# Patient Record
Sex: Female | Born: 2000 | Hispanic: Yes | Marital: Single | Smoking: Never smoker
Health system: Southern US, Community
[De-identification: ages and names within clinical notes are randomized; demographics above are authoritative.]

## PROBLEM LIST (undated history)

## (undated) ENCOUNTER — Inpatient Hospital Stay (HOSPITAL_COMMUNITY): Payer: Self-pay

## (undated) DIAGNOSIS — Z789 Other specified health status: Secondary | ICD-10-CM

## (undated) HISTORY — PX: WISDOM TOOTH EXTRACTION: SHX21

## (undated) HISTORY — PX: TONSILLECTOMY: SUR1361

## (undated) HISTORY — DX: Other specified health status: Z78.9

---

## 2021-09-10 ENCOUNTER — Other Ambulatory Visit: Payer: Self-pay

## 2021-09-10 ENCOUNTER — Emergency Department (HOSPITAL_BASED_OUTPATIENT_CLINIC_OR_DEPARTMENT_OTHER): Payer: Medicaid Other

## 2021-09-10 ENCOUNTER — Encounter (HOSPITAL_BASED_OUTPATIENT_CLINIC_OR_DEPARTMENT_OTHER): Payer: Self-pay | Admitting: *Deleted

## 2021-09-10 ENCOUNTER — Emergency Department (HOSPITAL_BASED_OUTPATIENT_CLINIC_OR_DEPARTMENT_OTHER)
Admission: EM | Admit: 2021-09-10 | Discharge: 2021-09-10 | Disposition: A | Discharge status: Home / Self Care | Payer: Medicaid Other | Attending: Emergency Medicine | Admitting: Emergency Medicine

## 2021-09-10 DIAGNOSIS — S6010XA Contusion of unspecified finger with damage to nail, initial encounter: Secondary | ICD-10-CM

## 2021-09-10 DIAGNOSIS — S60111A Contusion of right thumb with damage to nail, initial encounter: Secondary | ICD-10-CM | POA: Insufficient documentation

## 2021-09-10 DIAGNOSIS — S60931A Unspecified superficial injury of right thumb, initial encounter: Secondary | ICD-10-CM | POA: Diagnosis present

## 2021-09-10 DIAGNOSIS — W230XXA Caught, crushed, jammed, or pinched between moving objects, initial encounter: Secondary | ICD-10-CM | POA: Insufficient documentation

## 2021-09-10 NOTE — ED Notes (Signed)
Discharge instructions discussed with pt. Pt verbalized understanding with no questions at this time.  

## 2021-09-10 NOTE — ED Provider Notes (Signed)
[facility] EMERGENCY DEPARTMENT Provider Note   CSN: [id number] Arrival date & time: 09/10/21  1824     History Chief Complaint  Patient presents with   Finger Injury    Abigail Patterson is a 20 y.o. female.  Patient is a 20 year old female who presents with an injury to her right thumb.  She says she crushed in a car door this morning.  She has had some increasing subungual hematoma that started this morning and got bigger throughout the day.  She says she feels a lot of pain and pressure under her nail.  She denies any other injuries.      History reviewed. No pertinent past medical history.  There are no problems to display for this patient.   History reviewed. No pertinent surgical history.   OB History   No obstetric history on file.     No family history on file.  Social History   Tobacco Use   Smoking status: Never   Smokeless tobacco: Never  Substance Use Topics   Alcohol use: Never   Drug use: Never    Home Medications Prior to Admission medications   Not on File    Allergies    Patient has no known allergies.  Review of Systems   Review of Systems  Constitutional:  Negative for fever.  Gastrointestinal:  Negative for nausea and vomiting.  Musculoskeletal:  Positive for arthralgias and joint swelling. Negative for back pain and neck pain.  Skin:  Negative for wound.  Neurological:  Negative for weakness, numbness and headaches.   Physical Exam Updated Vital Signs BP 132/86   Pulse 91   Temp 98.7 F (37.1 C) (Oral)   Resp 20   Ht 5\' 2"  (1.575 m)   Wt 59 kg   LMP 07/20/2021   SpO2 100%   BMI 23.78 kg/m   Physical Exam Constitutional:      Appearance: She is well-developed.  HENT:     Head: Normocephalic and atraumatic.  Cardiovascular:     Rate and Rhythm: Normal rate.  Pulmonary:     Effort: Pulmonary effort is normal.  Musculoskeletal:        General: Tenderness present.     Cervical back: Normal range of motion and  neck supple.     Comments: Tenderness over right IP joint of thumb.  Moderate subungual hematoma present.  No open wounds  Skin:    General: Skin is warm and dry.  Neurological:     Mental Status: She is alert and oriented to person, place, and time.    ED Results / Procedures / Treatments   Labs (all labs ordered are listed, but only abnormal results are displayed) Labs Reviewed - No data to display  EKG None  Radiology DG Finger Thumb Right  Result Date: 09/10/2021 CLINICAL DATA:  Status post trauma. EXAM: RIGHT THUMB 2+V COMPARISON:  None. FINDINGS: There is no evidence of fracture or dislocation. There is no evidence of arthropathy or other focal bone abnormality. Soft tissues are unremarkable. IMPRESSION: Negative. Electronically Signed   By: 09/12/2021 M.D.   On: 09/10/2021 19:17    Procedures .Nail Removal  Date/Time: 09/10/2021 8:29 PM Performed by: 09/12/2021, MD Authorized by: Rolan Bucco, MD   Consent:    Consent obtained:  Verbal   Consent given by:  Patient   Risks, benefits, and alternatives were discussed: yes     Risks discussed:  Bleeding and infection   Alternatives discussed:  No treatment  Universal protocol:    Procedure explained and questions answered to patient or proxy's satisfaction: yes     Patient identity confirmed:  Verbally with patient Location:    Hand:  R thumb Pre-procedure details:    Skin preparation:  Alcohol Anesthesia:    Anesthesia method:  None Trephination:    Subungual hematoma drained: yes     Trephination instrument:  Cautery    Medications Ordered in ED Medications - No data to display  ED Course  I have reviewed the triage vital signs and the nursing notes.  Pertinent labs & imaging results that were available during my care of the patient were reviewed by me and considered in my medical decision making (see chart for details).    MDM Rules/Calculators/A&P                           Patient  presents with crush injury to the finger.  X-rays reveal no evidence of fracture.  There is a moderate subungual hematoma.  This was trephinated using cautery.  There was immediate release of the blood and she feels much better.  Dressing was applied.  She was discharged home in good condition.  Wound care instructions were given.  Return precautions were given. Final Clinical Impression(s) / ED Diagnoses Final diagnoses:  Subungual hematoma of digit of hand, initial encounter    Rx / DC Orders ED Discharge Orders     None        Rolan Bucco, MD 09/10/21 2032

## 2021-09-10 NOTE — ED Triage Notes (Signed)
She crushed her right thumb in a car door earlier today. Pressure under her nail with dark coloration noted.

## 2021-09-10 NOTE — ED Notes (Signed)
Provided cautery to Dr Fredderick Phenix for pt.

## 2022-10-22 IMAGING — DX DG FINGER THUMB 2+V*R*
3 series · 3 of 3 positions shown · non-contrast
Comparison: None.

CLINICAL DATA: Status post trauma.

EXAM:
RIGHT THUMB 2+V

[finger ap]
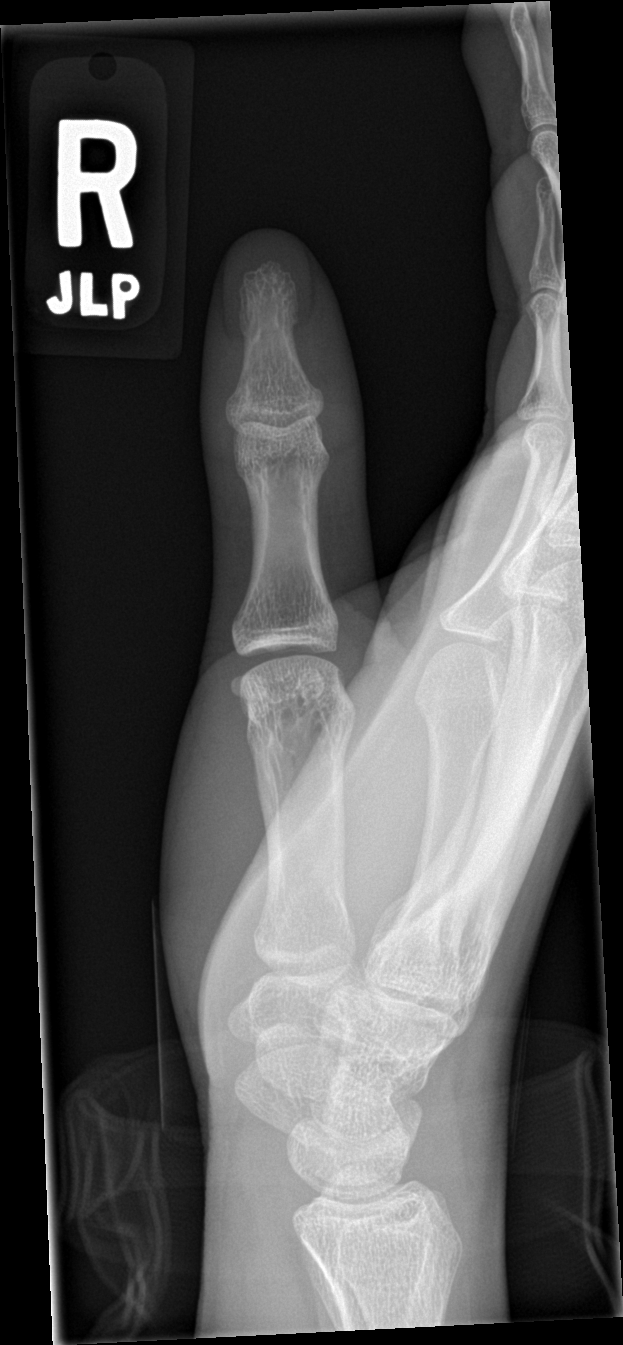

[finger obl]
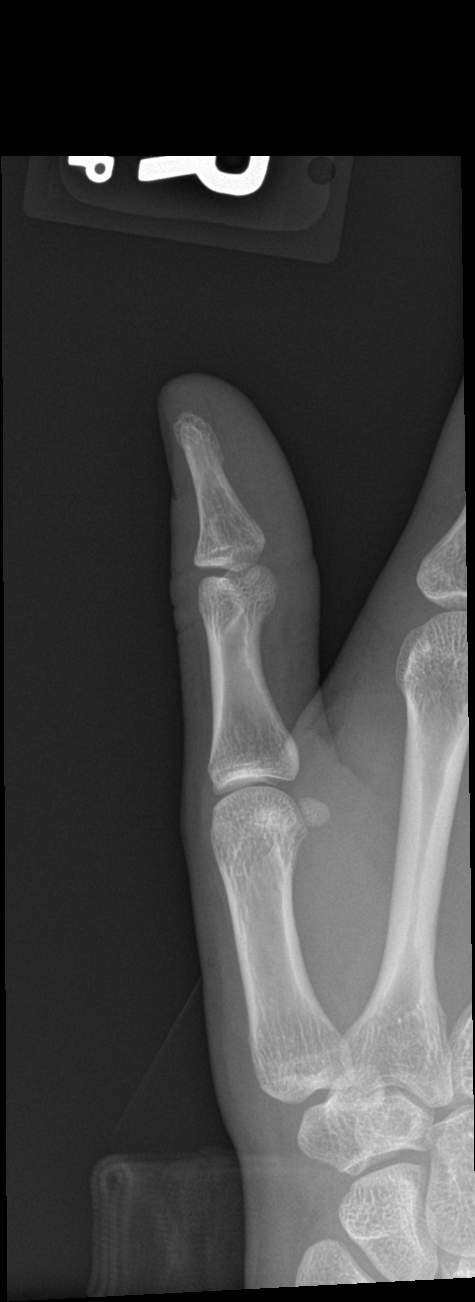

[finger lat]
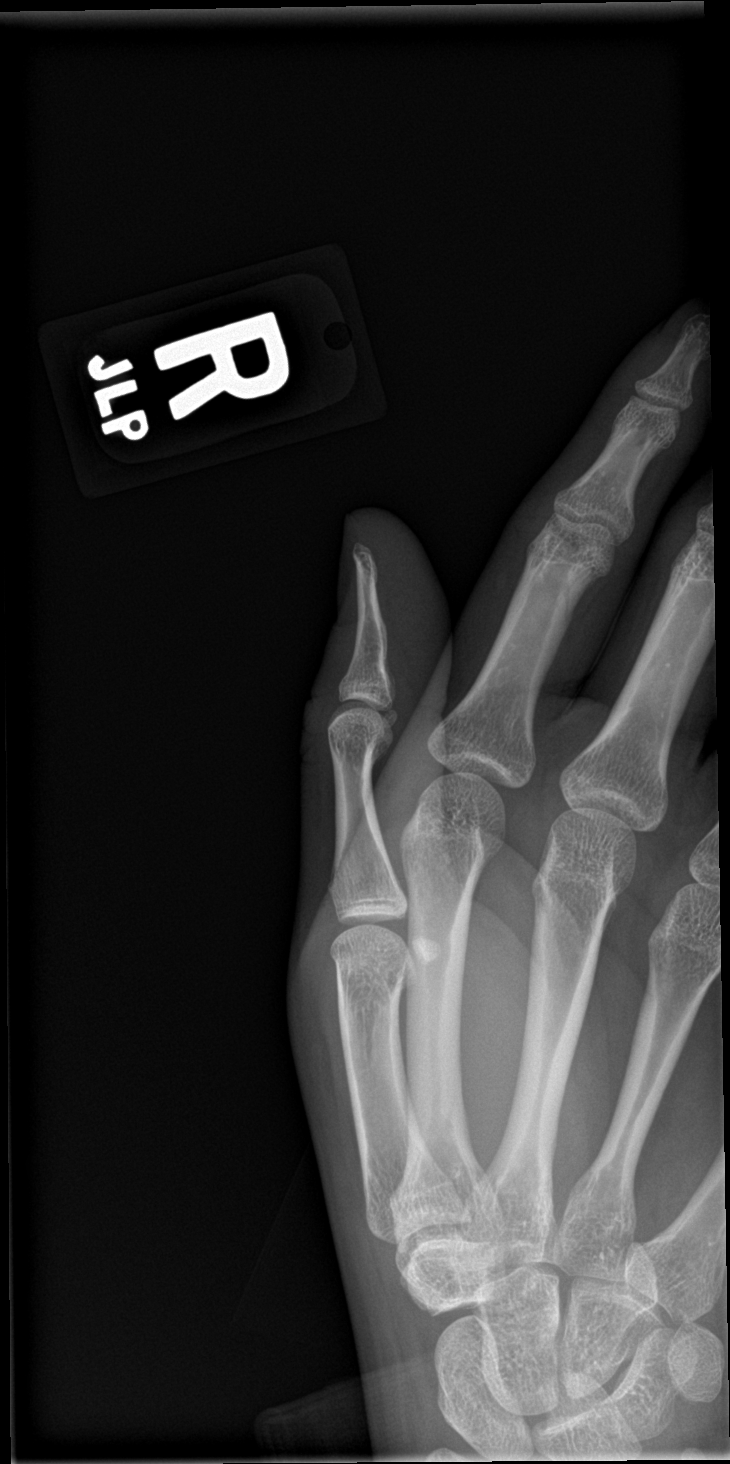

[3 of 3 positions shown; findings below may reference images not displayed]

FINDINGS: There is no evidence of fracture or dislocation. There is no
evidence of arthropathy or other focal bone abnormality. Soft
tissues are unremarkable.
IMPRESSION: Negative.

## 2023-09-01 ENCOUNTER — Ambulatory Visit (INDEPENDENT_AMBULATORY_CARE_PROVIDER_SITE_OTHER): Payer: Medicaid Other | Admitting: Family Medicine

## 2023-09-01 VITALS — BP 110/80 | HR 75 | Ht 62.0 in | Wt 131.7 lb

## 2023-09-01 DIAGNOSIS — Z3481 Encounter for supervision of other normal pregnancy, first trimester: Secondary | ICD-10-CM | POA: Diagnosis not present

## 2023-09-01 DIAGNOSIS — Z3201 Encounter for pregnancy test, result positive: Secondary | ICD-10-CM

## 2023-09-01 DIAGNOSIS — Z3200 Encounter for pregnancy test, result unknown: Secondary | ICD-10-CM

## 2023-09-01 LAB — POCT PREGNANCY, URINE: Preg Test, Ur: POSITIVE — AB

## 2023-09-01 MED ORDER — PROMETHAZINE HCL 12.5 MG PO TABS: 12.5000 mg | ORAL_TABLET | Freq: Four times a day (QID) | ORAL | 0 refills | Status: DC | PRN

## 2023-09-01 MED ORDER — PRENATAL PLUS VITAMIN/MINERAL 27-1 MG PO TABS: 1.0000 | ORAL_TABLET | Freq: Every day | ORAL | 11 refills | Status: AC

## 2023-09-01 NOTE — Progress Notes (Signed)
  History:  [name] is a 22 y.o. G1P0 who presents to clinic today with complaint of possible pregnancy.   Positive pregnancy Unplanned but welcome Regular period prior to conception  History reviewed. No pertinent past medical history.  Past Surgical History:  Procedure Laterality Date   WISDOM TOOTH EXTRACTION      The following portions of the patient's history were reviewed and updated as appropriate: allergies, current medications, past family history, past medical history, past social history, past surgical history and problem list.   Review of Systems:  Pertinent items noted in HPI and remainder of comprehensive ROS otherwise negative.  Objective:  Physical Exam BP 110/80   Pulse 75   Ht 5\' 2"  (1.575 m)   Wt 131 lb 11.2 oz (59.7 kg)   BMI 24.09 kg/m  Physical Exam Vitals reviewed.  Constitutional:      General: She is not in acute distress.    Appearance: She is well-developed. She is not diaphoretic.  Eyes:     General: No scleral icterus. Pulmonary:     Effort: Pulmonary effort is normal. No respiratory distress.  Skin:    General: Skin is warm and dry.  Neurological:     Mental Status: She is alert.     Coordination: Coordination normal.      Labs and Imaging Results for orders placed or performed in visit on 09/01/23 (from the past 24 hour(s))  Pregnancy, urine POC     Status: Abnormal   Collection Time: 09/01/23  9:00 AM  Result Value Ref Range   Preg Test, Ur POSITIVE (A) NEGATIVE    No results found.   Assessment & Plan:  1. Possible pregnancy +UPT, congratulated No medical issues First pregnancy Dating [place]   Approximately 15 minutes of total time was spent with this patient on history taking, coordination of care, education and documentation.   Abigail Maples, MD 09/01/2023 9:35 AM

## 2023-09-01 NOTE — Progress Notes (Signed)
Possible Pregnancy  Here today for pregnancy confirmation. UPT in office today is positive. Pt reports first positive home UPT on 08/08/2023. Reviewed dating with patient:   LMP: Exact LMP on 06/29/2023 EDD: 04/04/2024 9w 1d today  OB history reviewed. Reviewed medications and allergies with patient; list of medications safe to take during pregnancy given.  Recommended pt begin prenatal vitamin and schedule prenatal care.   Quintella Reichert, RN 09/01/2023  9:04 AM

## 2023-09-06 ENCOUNTER — Ambulatory Visit: Payer: Medicaid Other

## 2023-09-06 ENCOUNTER — Other Ambulatory Visit: Payer: Self-pay

## 2023-09-06 ENCOUNTER — Encounter: Payer: Self-pay | Admitting: Family Medicine

## 2023-09-06 DIAGNOSIS — Z3A09 9 weeks gestation of pregnancy: Secondary | ICD-10-CM

## 2023-09-06 DIAGNOSIS — Z3201 Encounter for pregnancy test, result positive: Secondary | ICD-10-CM

## 2023-09-06 DIAGNOSIS — Z3491 Encounter for supervision of normal pregnancy, unspecified, first trimester: Secondary | ICD-10-CM | POA: Diagnosis not present

## 2023-09-06 DIAGNOSIS — Z3200 Encounter for pregnancy test, result unknown: Secondary | ICD-10-CM

## 2023-09-11 ENCOUNTER — Telehealth: Payer: Medicaid Other

## 2023-09-11 DIAGNOSIS — Z3490 Encounter for supervision of normal pregnancy, unspecified, unspecified trimester: Secondary | ICD-10-CM

## 2023-09-11 NOTE — Progress Notes (Signed)
Called pt to complete intake appt.  Pt reports that she wanted to cancel her appt as she was told that our office does not take her [facility] insurance.  I informed pt that we do take her insurance and would love to take care of her and her baby.  Pt agrees to being rescheduled for intake appt (09/18/23) and call transferred to the front office so that pt's insurance can be verified.    Gaetano Romberger,RN  09/11/23

## 2023-09-18 ENCOUNTER — Telehealth: Payer: Medicaid Other

## 2023-09-18 ENCOUNTER — Encounter: Payer: Self-pay | Admitting: Family Medicine

## 2023-09-18 DIAGNOSIS — Z3A11 11 weeks gestation of pregnancy: Secondary | ICD-10-CM

## 2023-09-18 DIAGNOSIS — Z3401 Encounter for supervision of normal first pregnancy, first trimester: Secondary | ICD-10-CM | POA: Diagnosis not present

## 2023-09-18 DIAGNOSIS — Z3480 Encounter for supervision of other normal pregnancy, unspecified trimester: Secondary | ICD-10-CM

## 2023-09-18 MED ORDER — BLOOD PRESSURE KIT DEVI: 1.0000 | 0 refills | Status: DC | PRN

## 2023-09-18 MED ORDER — GOJJI WEIGHT SCALE MISC: 1.0000 | 0 refills | Status: DC | PRN

## 2023-09-18 NOTE — Progress Notes (Signed)
New OB Intake  I connected with Smitty Knudsen  on 09/18/23 at  3:15 PM EST by MyChart Video Visit and verified that I am speaking with the correct person using two identifiers. Nurse is located at [facility] and pt is located at home.  I discussed the limitations, risks, security and privacy concerns of performing an evaluation and management service by telephone and the availability of in person appointments. I also discussed with the patient that there may be a patient responsible charge related to this service. The patient expressed understanding and agreed to proceed.  I explained I am completing New OB Intake today. We discussed EDD of 04/04/2024, by Last Menstrual Period. Pt is G1P0. I reviewed her allergies, medications and Medical/Surgical/OB history.   Patient Active Problem List   Diagnosis Date Noted   Supervision of other normal pregnancy, antepartum 09/18/2023    Concerns addressed today: -Unsure if deliver at [facility], patient is located in [place].   Delivery Plans Plans to deliver at [facility] Discussed the nature of our practice with multiple providers including residents and students. Due to the size of the practice, the delivering provider may not be the same as those providing prenatal care.   Patient is not interested in water birth. Offered upcoming OB visit with CNM to discuss further.  MyChart/Babyscripts MyChart access verified. I explained pt will have some visits in office and some virtually. Babyscripts instructions given and order placed. Patient verifies receipt of registration text/e-mail. Account successfully created and app downloaded.  Blood Pressure Cuff/Weight Scale Blood pressure cuff ordered for patient to pick-up from [employer]. Explained after first prenatal appt pt will check weekly and document in Babyscripts. Patient does not have weight scale; order sent to [facility], patient may track weight weekly in Babyscripts.  Anatomy  US Explained first scheduled [place] will be around 19 weeks. Anatomy US scheduled for 11/09/2023 at 2:30 PM.  Is patient a CenteringPregnancy candidate?  Patient will think about it.   Is patient a Mom+Baby Combined Care candidate?  Patient will think about it.     If accepted, confirm patient does not intend to move from the area for at least 12 months, then notify Mom+Baby staff  Interested in [place]? If yes, send referral and doula dot phrase.   Is patient a candidate for Babyscripts Optimization? Yes, patient declined   First visit review I reviewed new OB appt with patient. Explained pt will be seen by Celedonio Savage, MD  at first visit. Discussed Avelina Laine genetic screening with patient. Yes [facility]or[facility] and [facility].. Routine prenatal labs is not   Last Pap 09/06/2022; Negative @ [facility] in care everywhere.   Vidal Schwalbe, [place] 09/18/2023  3:19 PM

## 2023-09-19 ENCOUNTER — Ambulatory Visit (INDEPENDENT_AMBULATORY_CARE_PROVIDER_SITE_OTHER): Payer: Self-pay | Admitting: Family Medicine

## 2023-09-19 ENCOUNTER — Other Ambulatory Visit (HOSPITAL_COMMUNITY)
Admission: RE | Admit: 2023-09-19 | Discharge: 2023-09-19 | Disposition: A | Discharge status: Home / Self Care | Payer: Medicaid Other | Source: Ambulatory Visit | Attending: Family Medicine | Admitting: Family Medicine

## 2023-09-19 ENCOUNTER — Other Ambulatory Visit: Payer: Self-pay

## 2023-09-19 VITALS — BP 117/71 | HR 89 | Wt 133.0 lb

## 2023-09-19 DIAGNOSIS — Z3481 Encounter for supervision of other normal pregnancy, first trimester: Secondary | ICD-10-CM

## 2023-09-19 DIAGNOSIS — Z3A11 11 weeks gestation of pregnancy: Secondary | ICD-10-CM

## 2023-09-19 DIAGNOSIS — Z3401 Encounter for supervision of normal first pregnancy, first trimester: Secondary | ICD-10-CM

## 2023-09-19 DIAGNOSIS — N898 Other specified noninflammatory disorders of vagina: Secondary | ICD-10-CM

## 2023-09-19 DIAGNOSIS — Z3480 Encounter for supervision of other normal pregnancy, unspecified trimester: Secondary | ICD-10-CM

## 2023-09-19 MED ORDER — ASPIRIN 81 MG PO TBEC: 81.0000 mg | DELAYED_RELEASE_TABLET | Freq: Every day | ORAL | 12 refills | Status: DC

## 2023-09-19 NOTE — Patient Instructions (Signed)

## 2023-09-19 NOTE — Progress Notes (Signed)
Subjective:   Abigail Patterson is a 22 y.o. G1P0 at [redacted]w[redacted]d by LMP being seen today for her first obstetrical visit.  Her obstetrical history is significant for  none . Patient does intend to breast feed. Pregnancy history fully reviewed.  Patient reports no bleeding, no contractions, no cramping, and no leaking.  HISTORY: OB History  Gravida Para Term Preterm AB Living  1 0 0 0 0 0  SAB IAB Ectopic Multiple Live Births  0 0 0 0 0    # Outcome Date GA Lbr Len/2nd Weight Sex Type Anes PTL Lv  1 Current            Last pap smear was  09/06/2022 and was normal Past Medical History:  Diagnosis Date   Medical history non-contributory    Past Surgical History:  Procedure Laterality Date   TONSILLECTOMY     WISDOM TOOTH EXTRACTION     Family History  Problem Relation Age of Onset   Hypertension Maternal Grandmother    Diabetes Maternal Grandmother    Diabetes Maternal Grandfather    Hypertension Maternal Grandfather    Hypertension Paternal Grandmother    Diabetes Paternal Grandmother    Diabetes Paternal Grandfather    Hypertension Paternal Grandfather    Social History   Tobacco Use   Smoking status: Never   Smokeless tobacco: Never  Vaping Use   Vaping status: Never Used  Substance Use Topics   Alcohol use: Never   Drug use: Never   No Known Allergies Current Outpatient Medications on File Prior to Visit  Medication Sig Dispense Refill   Prenatal Vit-Fe Fumarate-FA (PRENATAL PLUS VITAMIN/MINERAL) 27-1 MG TABS Take 1 tablet by mouth daily. 30 tablet 11   promethazine (PHENERGAN) 12.5 MG tablet Take 1 tablet (12.5 mg total) by mouth every 6 (six) hours as needed for nausea or vomiting. 30 tablet 0   Blood Pressure Monitoring (BLOOD PRESSURE KIT) DEVI 1 Device by Does not apply route as needed. (Patient not taking: Reported on 09/19/2023) 1 each 0   Misc. Devices (GOJJI WEIGHT SCALE) MISC 1 Device by Does not apply route as needed. (Patient not taking: Reported on  09/19/2023) 1 each 0   No current facility-administered medications on file prior to visit.     Exam   Vitals:   09/19/23 1447  BP: 117/71  Pulse: 89  Weight: 133 lb (60.3 kg)   Fetal Heart Rate (bpm): 164  System: General: well-developed, well-nourished female in no acute distress   Skin: normal coloration and turgor, no rashes   Neurologic: oriented, normal, negative, normal mood   Extremities: normal strength, tone, and muscle mass, ROM of all joints is normal   HEENT PERRLA, extraocular movement intact and sclera clear, anicteric   Mouth/Teeth mucous membranes moist, pharynx normal without lesions and dental hygiene good   Neck supple and no masses   Cardiovascular: regular rate and rhythm   Respiratory:  no respiratory distress, normal breath sounds   Abdomen: soft, non-tender; bowel sounds normal; no masses,  no organomegaly     Assessment:   Pregnancy: G1P0 Patient Active Problem List   Diagnosis Date Noted   Supervision of other normal pregnancy, antepartum 09/18/2023     Plan:  1. Supervision of other normal pregnancy, antepartum FHR and BP appropriate today Patient meets criteria for aspirin because of multiparity and maternal history of gestational hypertension - CBC/D/Plt+RPR+Rh+ABO+RubIgG... - Culture, OB Urine - Hemoglobin A1c - Cervicovaginal ancillary only  2. Vaginal discharge  Wet prep collected today  3. Encounter for supervision of other normal pregnancy, first trimester - HORIZON Basic Panel  4. Encounter for supervision of other normal pregnancy in first trimester - PANORAMA PRENATAL TEST   Initial labs drawn. Continue prenatal vitamins. Genetic Screening discussed, NIPS: ordered. Ultrasound discussed; fetal anatomic survey: ordered.  Scheduled for 11/09/2023 Problem list reviewed and updated. The nature of [facility] Faculty Practice with multiple MDs and other Advanced Practice Providers was explained to patient;  also emphasized that residents, students are part of our team. Routine obstetric precautions reviewed. No follow-ups on file.

## 2023-09-20 ENCOUNTER — Encounter: Payer: Self-pay | Admitting: *Deleted

## 2023-09-20 LAB — CBC/D/PLT+RPR+RH+ABO+RUBIGG...
Antibody Screen: NEGATIVE
Basophils Absolute: 0.1 10*3/uL (ref 0.0–0.2)
Basos: 1 %
EOS (ABSOLUTE): 0.3 10*3/uL (ref 0.0–0.4)
Eos: 3 %
HCV Ab: NONREACTIVE
HIV Screen 4th Generation wRfx: NONREACTIVE
Hematocrit: 40 % (ref 34.0–46.6)
Hemoglobin: 13.6 g/dL (ref 11.1–15.9)
Hepatitis B Surface Ag: NEGATIVE
Immature Grans (Abs): 0 10*3/uL (ref 0.0–0.1)
Immature Granulocytes: 0 %
Lymphocytes Absolute: 2.2 10*3/uL (ref 0.7–3.1)
Lymphs: 22 %
MCH: 33 pg (ref 26.6–33.0)
MCHC: 34 g/dL (ref 31.5–35.7)
MCV: 97 fL (ref 79–97)
Monocytes Absolute: 0.7 10*3/uL (ref 0.1–0.9)
Monocytes: 7 %
Neutrophils Absolute: 6.5 10*3/uL (ref 1.4–7.0)
Neutrophils: 67 %
Platelets: 290 10*3/uL (ref 150–450)
RBC: 4.12 x10E6/uL (ref 3.77–5.28)
RDW: 11.9 % (ref 11.7–15.4)
RPR Ser Ql: NONREACTIVE
Rh Factor: POSITIVE
Rubella Antibodies, IGG: 11.2 {index} (ref >0.99)
WBC: 9.6 10*3/uL (ref 3.4–10.8)

## 2023-09-20 LAB — HEMOGLOBIN A1C
Est. average glucose Bld gHb Est-mCnc: 97 mg/dL
Hgb A1c MFr Bld: 5 % (ref 4.8–5.6)

## 2023-09-20 LAB — HCV INTERPRETATION

## 2023-09-21 LAB — URINE CULTURE, OB REFLEX

## 2023-09-21 LAB — CULTURE, OB URINE

## 2023-09-22 LAB — CERVICOVAGINAL ANCILLARY ONLY
Bacterial Vaginitis (gardnerella): NEGATIVE
Candida Glabrata: NEGATIVE
Candida Vaginitis: NEGATIVE
Chlamydia: NEGATIVE
Comment: NEGATIVE
Comment: NEGATIVE
Comment: NEGATIVE
Comment: NEGATIVE
Comment: NEGATIVE
Comment: NORMAL
Neisseria Gonorrhea: NEGATIVE
Trichomonas: NEGATIVE

## 2023-09-26 ENCOUNTER — Encounter: Payer: Self-pay | Admitting: Obstetrics and Gynecology

## 2023-09-26 LAB — PANORAMA PRENATAL TEST FULL PANEL:PANORAMA TEST PLUS 5 ADDITIONAL MICRODELETIONS: FETAL FRACTION: 8.6

## 2023-09-26 LAB — HORIZON CUSTOM: REPORT SUMMARY: NEGATIVE

## 2023-10-17 ENCOUNTER — Other Ambulatory Visit: Payer: Self-pay

## 2023-10-17 ENCOUNTER — Ambulatory Visit (INDEPENDENT_AMBULATORY_CARE_PROVIDER_SITE_OTHER): Payer: Medicaid Other | Admitting: Obstetrics and Gynecology

## 2023-10-17 VITALS — BP 119/81 | HR 86 | Wt 134.7 lb

## 2023-10-17 DIAGNOSIS — Z3480 Encounter for supervision of other normal pregnancy, unspecified trimester: Secondary | ICD-10-CM

## 2023-10-17 DIAGNOSIS — Z3A15 15 weeks gestation of pregnancy: Secondary | ICD-10-CM

## 2023-10-17 DIAGNOSIS — Z3482 Encounter for supervision of other normal pregnancy, second trimester: Secondary | ICD-10-CM

## 2023-10-17 NOTE — Patient Instructions (Signed)

## 2023-10-17 NOTE — Progress Notes (Signed)
   PRENATAL VISIT NOTE  Subjective:  Abigail Patterson is a 22 y.o. G1P0 at [redacted]w[redacted]d being seen today for ongoing prenatal care.  She is currently monitored for the following issues for this low-risk pregnancy and has Supervision of other normal pregnancy, antepartum on their problem list.  Patient reports no bleeding and stuffy nose and mild congestion  .  Contractions: Not present. Vag. Bleeding: None.  Movement: Absent. Denies leaking of fluid.   The following portions of the patient's history were reviewed and updated as appropriate: allergies, current medications, past family history, past medical history, past social history, past surgical history and problem list.   Objective:   Vitals:   10/17/23 1343  BP: 119/81  Pulse: 86  Weight: 134 lb 11.2 oz (61.1 kg)    Fetal Status: Fetal Heart Rate (bpm): 145   Movement: Absent     General:  Alert, oriented and cooperative. Patient is in no acute distress.  Skin: Skin is warm and dry. No rash noted.   Cardiovascular: Normal heart rate noted  Respiratory: Normal respiratory effort, no problems with respiration noted  Abdomen: Soft, gravid, appropriate for gestational age.  Pain/Pressure: Present     Pelvic: Cervical exam deferred        Extremities: Normal range of motion.  Edema: None  Mental Status: Normal mood and affect. Normal behavior. Normal judgment and thought content.   Assessment and Plan:  Pregnancy: G1P0 at [redacted]w[redacted]d 1. Supervision of other normal pregnancy, antepartum (Primary) FHR wnl  Discussed AFP testing - AFP, Serum, Open Spina Bifida  2. [redacted] weeks gestation of pregnancy AFP  Preterm labor symptoms and general obstetric precautions including but not limited to vaginal bleeding, contractions, leaking of fluid and fetal movement were reviewed in detail with the patient. Please refer to After Visit Summary for other counseling recommendations.   No follow-ups on file.  Future Appointments  Date Time Provider Department  Center  11/09/2023  2:15 PM [facility] NURSE [facility]  11/09/2023  2:30 PM [facility] US2 [facility]    Carter Quarry, MD

## 2023-10-18 NOTE — L&D Delivery Note (Signed)
 OB/GYN Faculty Practice Delivery Note  [name] is a 23 y.o. G1P0 s/p SVD at [redacted]w[redacted]d. She was admitted for SOL.   ROM: 3h 80m with clear fluid GBS Status: Positive - received adequate PCN prior to delivery  Maximum Maternal Temperature: 99.43F  Labor Progress: Initial SVE 5/100/0. Received AROM and pitocin. Progressed to complete.   Delivery Date/Time: 04/04/24 @ 1253 Delivery: Called to room and patient was complete and pushing. Head delivered LOA. No nuchal cord present. Shoulder and body delivered in usual fashion. Infant with spontaneous cry, placed on mother's abdomen, dried and stimulated. Cord clamped x 2 after 1-minute delay, and cut by FOB under direct supervision. Cord blood drawn. I/O bladder catheterization performed with 50cc clear urine drained. Placenta delivered spontaneously with gentle cord traction. Fundus firm with massage and Pitocin. Labia, perineum, vagina, and cervix were inspected. A left vaginal sulcus laceration and left periurethral tears noted, which were repaired with in the usual fashion.  Lower uterine manually swept with removal of several small to moderate sized clots.  Hemostasis achieved.   Placenta: complete, three vessel cord appreciated Complications: N/a Lacerations: 1st degree vaginal tear  EBL: 203 mL Analgesia: Epidural  Infant:  APGAR (1 MIN):  8 APGAR (5 MINS):  9   Carey Chapman, MD   Attestation of Supervision of Resident:  I confirm that I have verified the information documented in the resident's note and that I have also was gloved and available for the entirety of the delivery and personally performed the repair.  I have verified that all services and findings are accurately documented in this resident's note; and I agree with management and plan as outlined in the documentation. I have also made any necessary editorial changes.   Melanie Spires, MD OB Fellow, Faculty Practice [facility], Center for [facility]

## 2023-10-20 LAB — AFP, SERUM, OPEN SPINA BIFIDA
AFP MoM: 0.81
AFP Value: 27.8 ng/mL
Gest. Age on Collection Date: 15.7 wk
Maternal Age At EDD: 23.1 a
OSBR Risk 1 IN: 10000
Test Results:: NEGATIVE
Weight: 134 [lb_av]

## 2023-11-09 ENCOUNTER — Ambulatory Visit: Payer: Medicaid Other | Attending: Family Medicine

## 2023-11-09 ENCOUNTER — Ambulatory Visit: Payer: Medicaid Other

## 2023-11-09 ENCOUNTER — Other Ambulatory Visit: Payer: Self-pay

## 2023-11-09 DIAGNOSIS — Z363 Encounter for antenatal screening for malformations: Secondary | ICD-10-CM | POA: Diagnosis not present

## 2023-11-09 DIAGNOSIS — Z3480 Encounter for supervision of other normal pregnancy, unspecified trimester: Secondary | ICD-10-CM | POA: Diagnosis present

## 2023-11-09 DIAGNOSIS — Z3A19 19 weeks gestation of pregnancy: Secondary | ICD-10-CM | POA: Insufficient documentation

## 2023-11-14 ENCOUNTER — Other Ambulatory Visit: Payer: Self-pay

## 2023-11-14 ENCOUNTER — Ambulatory Visit (INDEPENDENT_AMBULATORY_CARE_PROVIDER_SITE_OTHER): Payer: Medicaid Other | Admitting: Certified Nurse Midwife

## 2023-11-14 VITALS — BP 109/71 | HR 88 | Wt 144.4 lb

## 2023-11-14 DIAGNOSIS — Z3A19 19 weeks gestation of pregnancy: Secondary | ICD-10-CM | POA: Insufficient documentation

## 2023-11-14 DIAGNOSIS — O99012 Anemia complicating pregnancy, second trimester: Secondary | ICD-10-CM

## 2023-11-14 DIAGNOSIS — O99019 Anemia complicating pregnancy, unspecified trimester: Secondary | ICD-10-CM | POA: Insufficient documentation

## 2023-11-14 DIAGNOSIS — Z3402 Encounter for supervision of normal first pregnancy, second trimester: Secondary | ICD-10-CM | POA: Insufficient documentation

## 2023-11-14 MED ORDER — FERROUS GLUCONATE 324 (38 FE) MG PO TABS: 324.0000 mg | ORAL_TABLET | ORAL | 1 refills | Status: DC

## 2023-11-14 MED ORDER — DOCUSATE SODIUM 100 MG PO CAPS: 100.0000 mg | ORAL_CAPSULE | Freq: Two times a day (BID) | ORAL | 0 refills | Status: DC

## 2023-11-14 NOTE — Progress Notes (Signed)
   PRENATAL VISIT NOTE  Subjective:  Abigail Patterson is a 23 y.o. G1P0 at [redacted]w[redacted]d being seen today for ongoing prenatal care.  She is currently monitored for the following issues for this low-risk pregnancy and has Supervision of other normal pregnancy, antepartum; Anemia affecting pregnancy; [redacted] weeks gestation of pregnancy; and Encounter for supervision of low-risk first pregnancy in second trimester on their problem list.  Patient reports fatigue and excessive sleep, SOB on exertion only .   . Vag. Bleeding: None.  Movement: Present. Denies leaking of fluid.   The following portions of the patient's history were reviewed and updated as appropriate: allergies, current medications, past family history, past medical history, past social history, past surgical history and problem list.   Objective:   Vitals:   11/14/23 1438  BP: 109/71  Pulse: 88  Weight: 144 lb 6.4 oz (65.5 kg)    Fetal Status: Fetal Heart Rate (bpm): 141   Movement: Present     General:  Alert, oriented and cooperative. Appears pale, NAD  Skin: Skin is warm and dry. Visually pale    Cardiovascular: Normal heart rate noted  Respiratory: Normal respiratory effort, no problems with respiration noted  Abdomen: Soft, gravid, appropriate for gestational age.    Pelvic: Cervical exam deferred        Extremities: Normal range of motion.     Mental Status: Normal mood and affect. Normal behavior. Normal judgment and thought content.   Assessment and Plan:  Pregnancy: G1P0 at [redacted]w[redacted]d   1. Encounter for supervision of low-risk first pregnancy in second trimester (Primary) - Anatomy US reviewed  2. [redacted] weeks gestation of pregnancy  3. Anemia affecting pregnancy in second trimester - c/o fatigue, SOB with exertion and c/o excessive sleep , patient not currentlly taking iron supplements and after reviewing her diet unable to confirm dietary iron consumption is sufficient in pregnancy  - CBC  - RX sent for Ferrous every other day  and Colace prn  Preterm labor symptoms and general obstetric precautions including but not limited to vaginal bleeding, contractions, leaking of fluid and fetal movement were reviewed in detail with the patient. Please refer to After Visit Summary for other counseling recommendations.   Return in about 4 weeks (around 12/12/2023) for LOB.  Orders Placed This Encounter  Procedures   CBC     Marcell Barlow, MSN, [facility], Center for [employer]

## 2023-11-15 ENCOUNTER — Encounter: Payer: Self-pay | Admitting: Obstetrics and Gynecology

## 2023-11-15 LAB — CBC
Hematocrit: 35.2 % (ref 34.0–46.6)
Hemoglobin: 11.7 g/dL (ref 11.1–15.9)
MCH: 33.6 pg — ABNORMAL HIGH (ref 26.6–33.0)
MCHC: 33.2 g/dL (ref 31.5–35.7)
MCV: 101 fL — ABNORMAL HIGH (ref 79–97)
Platelets: 254 10*3/uL (ref 150–450)
RBC: 3.48 x10E6/uL — ABNORMAL LOW (ref 3.77–5.28)
RDW: 13.1 % (ref 11.7–15.4)
WBC: 11.6 10*3/uL — ABNORMAL HIGH (ref 3.4–10.8)

## 2023-12-12 ENCOUNTER — Other Ambulatory Visit: Payer: Self-pay

## 2023-12-12 ENCOUNTER — Ambulatory Visit: Payer: Medicaid Other | Admitting: Certified Nurse Midwife

## 2023-12-12 VITALS — BP 118/74 | HR 80 | Wt 152.3 lb

## 2023-12-12 DIAGNOSIS — Z3A23 23 weeks gestation of pregnancy: Secondary | ICD-10-CM

## 2023-12-12 DIAGNOSIS — Z3402 Encounter for supervision of normal first pregnancy, second trimester: Secondary | ICD-10-CM

## 2023-12-12 DIAGNOSIS — O99012 Anemia complicating pregnancy, second trimester: Secondary | ICD-10-CM | POA: Diagnosis not present

## 2023-12-12 NOTE — Patient Instructions (Addendum)
 Safe Medications in Pregnancy   Acne:  Benzoyl Peroxide  Salicylic Acid   Backache/Headache:  Tylenol: 2 regular strength every 4 hours OR               2 Extra strength every 6 hours   Colds/Coughs/Allergies:  Benadryl (alcohol free) 25 mg every 6 hours as needed  Breath right strips  Claritin  Cepacol throat lozenges  Chloraseptic throat spray  Cold-Eeze- up to three times per day  Cough drops, alcohol free  Flonase (by prescription only)  Guaifenesin  Mucinex  Robitussin DM (plain only, alcohol free)  Saline nasal spray/drops  Sudafed (pseudoephedrine) & Actifed * use only after [redacted] weeks gestation and if you do not have high blood pressure  Tylenol  Vicks Vaporub  Zinc lozenges  Zyrtec   Constipation:  Colace  Ducolax suppositories  Fleet enema  Glycerin suppositories  Metamucil  Milk of magnesia  Miralax  Senokot  Smooth move tea   Diarrhea:  Kaopectate  Imodium A-D   *NO pepto Bismol   Hemorrhoids:  Anusol  Anusol HC  Preparation H  Tucks   Indigestion:  Tums  Maalox  Mylanta  Zantac  Pepcid   Insomnia:  Benadryl (alcohol free) 25mg  every 6 hours as needed  Tylenol PM  Unisom, no Gelcaps   Leg Cramps:  Tums  MagGel   Nausea/Vomiting:  Bonine  Dramamine  Emetrol  Ginger extract  Sea bands  Meclizine  Nausea medication to take during pregnancy:  Unisom (doxylamine succinate 25 mg tablets) Take one tablet daily at bedtime. If symptoms are not adequately controlled, the dose can be increased to a maximum recommended dose of two tablets daily (1/2 tablet in the morning, 1/2 tablet mid-afternoon and one at bedtime).  Vitamin B6 100mg  tablets. Take one tablet twice a day (up to 200 mg per day).   Skin Rashes:  Aveeno products  Benadryl cream or 25mg  every 6 hours as needed  Calamine Lotion  1% cortisone cream   Yeast infection:  Gyne-lotrimin 7  Monistat 7    **If taking multiple medications, please check labels to avoid  duplicating the same active ingredients  **take medication as directed on the label  ** Do not exceed 4000 mg of tylenol in 24 hours  **Do not take medications that contain aspirin or ibuprofen          Magnesium oxide start with 200mg  increase to 400mg  as tolerated at bedtime for promotion of rest.    Considering Waterbirth? Guide for patients at [facility]) Why consider waterbirth? Gentle birth for babies  Less pain medicine used in labor  May allow for passive descent/less pushing  May reduce perineal tears  More mobility and instinctive maternal position changes  Increased maternal relaxation   Is waterbirth safe? What are the risks of infection, drowning or other complications? Infection:  Very low risk (3.7 % for tub vs 4.8% for bed)  7 in 8000 waterbirths with documented infection  Poorly cleaned equipment most common cause  Slightly lower group B strep transmission rate  Drowning  Maternal:  Very low risk  Related to seizures or fainting  Newborn:  Very low risk. No evidence of increased risk of respiratory problems in multiple large studies  Physiological protection from breathing under water  Avoid underwater birth if there are any fetal complications  Once baby's head is out of the water, keep it out.  Birth complication  Some reports of cord trauma, but risk  decreased by bringing baby to surface gradually  No evidence of increased risk of shoulder dystocia. Mothers can usually change positions faster in water than in a bed, possibly aiding the maneuvers to free the shoulder.   There are 2 things you MUST do to have a waterbirth with [facility]: Attend a waterbirth class at [facility] at [facility]   3rd Wednesday of every month from 7-9 pm (virtual during COVID) [employer] at [email or web address] or [email or web address] or by calling [phone] Bring [place] the certificate from the class to your prenatal  appointment or send via MyChart Meet with a midwife at 36 weeks* to see if you can still plan a waterbirth and to sign the consent.   *We also recommend that you schedule as many of your prenatal visits with a midwife as possible.    Helpful information: You may want to bring a bathing suit top to the hospital to wear during labor but this is optional.  All other supplies are provided by the hospital. Please arrive at the hospital with signs of active labor, and do not wait at home until late in labor. It takes 45 min- 1 hour for fetal monitoring, and check in to your room to take place, plus transport and filling of the waterbirth tub.    Things that would prevent you from having a waterbirth: Premature, <37wks  Previous cesarean birth  Presence of thick meconium-stained fluid  Multiple gestation (Twins, triplets, etc.)  Uncontrolled diabetes or gestational diabetes requiring medication  Hypertension diagnosed in pregnancy or preexisting hypertension (gestational hypertension, preeclampsia, or chronic hypertension) Fetal growth restriction (your baby measures less than 10th percentile on ultrasound) Heavy vaginal bleeding  Non-reassuring fetal heart rate  Active infection (MRSA, etc.). Group B Strep is NOT a contraindication for waterbirth.  If your labor has to be induced and induction method requires continuous monitoring of the baby's heart rate  Other risks/issues identified by your obstetrical provider   Please remember that birth is unpredictable. Under certain unforeseeable circumstances your provider may advise against giving birth in the tub. These decisions will be made on a case-by-case basis and with the safety of you and your baby as our highest priority.    Updated 01/19/22

## 2023-12-12 NOTE — Progress Notes (Signed)
   PRENATAL VISIT NOTE  Subjective:  Abigail Patterson is a 23 y.o. G1P0 at [redacted]w[redacted]d being seen today for ongoing prenatal care.  She is currently monitored for the following issues for this low-risk pregnancy and has Supervision of other normal pregnancy, antepartum; Anemia affecting pregnancy; [redacted] weeks gestation of pregnancy; and Encounter for supervision of low-risk first pregnancy in second trimester on their problem list.  Patient reports no complaints.  Contractions: Not present. Vag. Bleeding: None.  Movement: Present. Denies leaking of fluid.   The following portions of the patient's history were reviewed and updated as appropriate: allergies, current medications, past family history, past medical history, past social history, past surgical history and problem list.   Objective:   Vitals:   12/12/23 1139  BP: 118/74  Pulse: 80  Weight: 152 lb 4.8 oz (69.1 kg)    Fetal Status: Fetal Heart Rate (bpm): 135   Movement: Present     General:  Alert, oriented and cooperative. Patient is in no acute distress.  Skin: Skin is warm and dry. No rash noted.   Cardiovascular: Normal heart rate noted  Respiratory: Normal respiratory effort, no problems with respiration noted  Abdomen: Soft, gravid, appropriate for gestational age.  Pain/Pressure: Present     Pelvic: Cervical exam deferred        Extremities: Normal range of motion.  Edema: None  Mental Status: Normal mood and affect. Normal behavior. Normal judgment and thought content.   Assessment and Plan:  Pregnancy: G1P0 at [redacted]w[redacted]d 1. Encounter for supervision of low-risk first pregnancy in second trimester (Primary) - Doing well, feeling regular and vigorous fetal movement  2. [redacted] weeks gestation of pregnancy - Routine PN care.  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 Advised good ventilation, mask, gloves, etc.   3. Anemia affecting pregnancy in second trimester - Continue good dietary choices.   - Counseled on iron optimization.   - Pt is interested in waterbirth.  No contraindications at this time per chart review/patient assessment.   - Pt to enroll in class, see CNMs for most visits in the office.  - Discussed waterbirth as option for low-risk pregnancy.  Reviewed conditions that may arise during pregnancy that will risk pt out of waterbirth including hypertension, diabetes, fetal growth restriction <10%ile, etc.   Preterm labor symptoms and general obstetric precautions including but not limited to vaginal bleeding, contractions, leaking of fluid and fetal movement were reviewed in detail with the patient. Please refer to After Visit Summary for other counseling recommendations.   Return in about 4 weeks (around 01/09/2024) for LOB with GTT, with CNM.  Future Appointments  Date Time Provider Department Center  01/09/2024  8:20 AM [facility]  01/09/2024  9:15 AM Jonah Blue Clarita Crane, CNM [facility]    Richardson Landry, CNM

## 2023-12-12 NOTE — Progress Notes (Deleted)
   PRENATAL VISIT NOTE  Subjective:  Abigail Patterson is a 23 y.o. G1P0 at [redacted]w[redacted]d being seen today for ongoing prenatal care.  She is currently monitored for the following issues for this low-risk pregnancy and has Supervision of other normal pregnancy, antepartum; Anemia affecting pregnancy; [redacted] weeks gestation of pregnancy; and Encounter for supervision of low-risk first pregnancy in second trimester on their problem list.  Patient reports no complaints.  Contractions: Not present. Vag. Bleeding: None.  Movement: Present. Denies leaking of fluid.   The following portions of the patient's history were reviewed and updated as appropriate: allergies, current medications, past family history, past medical history, past social history, past surgical history and problem list.   Objective:   Vitals:   12/12/23 1139  BP: 118/74  Pulse: 80  Weight: 152 lb 4.8 oz (69.1 kg)    Fetal Status: Fetal Heart Rate (bpm): 135   Movement: Present     General:  Alert, oriented and cooperative. Patient is in no acute distress.  Skin: Skin is warm and dry. No rash noted.   Cardiovascular: Normal heart rate noted  Respiratory: Normal respiratory effort, no problems with respiration noted  Abdomen: Soft, gravid, appropriate for gestational age.  Pain/Pressure: Present     Pelvic: Cervical exam deferred        Extremities: Normal range of motion.  Edema: None  Mental Status: Normal mood and affect. Normal behavior. Normal judgment and thought content.   Assessment and Plan:  Pregnancy: G1P0 at [redacted]w[redacted]d 1. Encounter for supervision of low-risk first pregnancy in second trimester (Primary) ***  2. [redacted] weeks gestation of pregnancy ***  3. Anemia affecting pregnancy in second trimester ***  {Blank single:19197::"Term","Preterm"} labor symptoms and general obstetric precautions including but not limited to vaginal bleeding, contractions, leaking of fluid and fetal movement were reviewed in detail with the  patient. Please refer to After Visit Summary for other counseling recommendations.   Return in about 4 weeks (around 01/09/2024) for LOB with GTT, with CNM.  Future Appointments  Date Time Provider Department Center  01/09/2024  8:20 AM [facility]  01/09/2024  9:15 AM Jonah Blue Clarita Crane, CNM [facility]    Richardson Landry, CNM

## 2024-01-03 ENCOUNTER — Other Ambulatory Visit: Payer: Self-pay

## 2024-01-03 DIAGNOSIS — Z3A26 26 weeks gestation of pregnancy: Secondary | ICD-10-CM

## 2024-01-07 NOTE — Progress Notes (Unsigned)
   PRENATAL VISIT NOTE  Subjective:  Abigail Patterson is a 23 y.o. G1P0 at [redacted]w[redacted]d being seen today for ongoing prenatal care.  She is currently monitored for the following issues for this {Blank single:19197::"high-risk","low-risk"} pregnancy and has Supervision of other normal pregnancy, antepartum; Anemia affecting pregnancy; [redacted] weeks gestation of pregnancy; and Encounter for supervision of low-risk first pregnancy in second trimester on their problem list.  Patient reports {sx:14538}.   .  .   . Denies leaking of fluid.   The following portions of the patient's history were reviewed and updated as appropriate: allergies, current medications, past family history, past medical history, past social history, past surgical history and problem list.   Objective:  There were no vitals filed for this visit.  Fetal Status:           General:  Alert, oriented and cooperative. Patient is in no acute distress.  Skin: Skin is warm and dry. No rash noted.   Cardiovascular: Normal heart rate noted  Respiratory: Normal respiratory effort, no problems with respiration noted  Abdomen: Soft, gravid, appropriate for gestational age.        Pelvic: {Blank single:19197::"Cervical exam performed in the presence of a chaperone","Cervical exam deferred"}        Extremities: Normal range of motion.     Mental Status: Normal mood and affect. Normal behavior. Normal judgment and thought content.   Assessment and Plan:  Pregnancy: G1P0 at [redacted]w[redacted]d 1. Encounter for supervision of low-risk first pregnancy in second trimester (Primary) ***  2. [redacted] weeks gestation of pregnancy ***  {Blank single:19197::"Term","Preterm"} labor symptoms and general obstetric precautions including but not limited to vaginal bleeding, contractions, leaking of fluid and fetal movement were reviewed in detail with the patient. Please refer to After Visit Summary for other counseling recommendations.   Return in about 2 weeks (around 01/23/2024)  for LOB, CNM please.  Future Appointments  Date Time Provider Department Center  01/09/2024  8:20 AM [facility]  01/09/2024  9:15 AM Jonah Blue Clarita Crane, CNM [facility]    Richardson Landry, CNM

## 2024-01-09 ENCOUNTER — Other Ambulatory Visit: Payer: Medicaid Other

## 2024-01-09 ENCOUNTER — Other Ambulatory Visit: Payer: Self-pay

## 2024-01-09 ENCOUNTER — Ambulatory Visit: Payer: Self-pay | Admitting: Certified Nurse Midwife

## 2024-01-09 VITALS — BP 102/69 | HR 86 | Wt 162.9 lb

## 2024-01-09 DIAGNOSIS — Z23 Encounter for immunization: Secondary | ICD-10-CM | POA: Diagnosis not present

## 2024-01-09 DIAGNOSIS — N949 Unspecified condition associated with female genital organs and menstrual cycle: Secondary | ICD-10-CM | POA: Diagnosis not present

## 2024-01-09 DIAGNOSIS — Z3402 Encounter for supervision of normal first pregnancy, second trimester: Secondary | ICD-10-CM | POA: Diagnosis not present

## 2024-01-09 DIAGNOSIS — G5603 Carpal tunnel syndrome, bilateral upper limbs: Secondary | ICD-10-CM | POA: Diagnosis not present

## 2024-01-09 DIAGNOSIS — Z3A27 27 weeks gestation of pregnancy: Secondary | ICD-10-CM

## 2024-01-09 DIAGNOSIS — Z3A26 26 weeks gestation of pregnancy: Secondary | ICD-10-CM

## 2024-01-09 NOTE — Patient Instructions (Addendum)
 I am recommending Magnesium nightly for swelling and promotion of rest. I recommend Magnesium glycinate 200mg  and working up to 400mg .  You can also get wrist braces for your carpal tunnel symptoms and elevate your hands at night.   Round Ligament Pain During Pregnancy Round ligament pain is a sharp pain or jabbing feeling often felt in the lower belly or groin area on one or both sides. It is one of the most common complaints during pregnancy and is considered a normal part of pregnancy. It is most often felt during the second trimester.  Here is what you need to know about round ligament pain, including some tips to help you feel better.  Causes of Round Ligament Pain  Several thick ligaments surround and support your womb (uterus) as it grows during pregnancy. One of them is called the round ligament.  The round ligament connects the front part of the womb to your groin, the area where your legs attach to your pelvis. The round ligament normally tightens and relaxes slowly.  As your baby and womb grow, the round ligament stretches. That makes it more likely to become strained.  Sudden movements can cause the ligament to tighten quickly, like a rubber band snapping. This causes a sudden and quick jabbing feeling.  Symptoms of Round Ligament Pain  Round ligament pain can be concerning and uncomfortable. But it is considered normal as your body changes during pregnancy.  The symptoms of round ligament pain include a sharp, sudden spasm in the belly. It usually affects the right side, but it may happen on both sides. The pain only lasts a few seconds.  Exercise may cause the pain, as will rapid movements such as:  sneezing coughing laughing rolling over in bed standing up too quickly  Comfort Measures:  Soak in a warm tub Tylenol 1000 mg by mouth every 6-8 hrs as needed for pain Slow position changes Laying on the affected side    Massage: Starting at the middle of your pubic  bone, trace little circles in a wide U from your pubic bone to your hip bones on both sides.  Then starting just above your pubic bone, press in and down, alternating sides to create a gentle rocking of your uterus back and forth.  Move your hands up the sides of your belly and back down. Do this 3-5 times upon waking and before bed.   Stretches: Get on hands and knees and alternate arching your back deeply while inhaling, and then rounding your back while exhaling. Modified runners lunge:  - Sit on a chair with half of your bottom on the chair and half off.  - Sit up tall, plant your front foot, and stretch your other foot out behind you.  - Breathe deeply for 5 breaths and then do the other side.   Also chiropractors and massages are safe in pregnancy. [facility] in [place] specializes in pregnancy care. You can visit their website at: [email or web address] to schedule a new patient chiropractic treatment (1 hour) appointment. Please let us know if you have any other questions or concerns.  Maternity Support [employer] can purchase on [place] or through [place]. Below is an example.

## 2024-01-10 ENCOUNTER — Encounter: Payer: Self-pay | Admitting: Certified Nurse Midwife

## 2024-01-10 LAB — CBC
Hematocrit: 38.5 % (ref 34.0–46.6)
Hemoglobin: 12.7 g/dL (ref 11.1–15.9)
MCH: 34.7 pg — ABNORMAL HIGH (ref 26.6–33.0)
MCHC: 33 g/dL (ref 31.5–35.7)
MCV: 105 fL — ABNORMAL HIGH (ref 79–97)
Platelets: 252 x10E3/uL (ref 150–450)
RBC: 3.66 x10E6/uL — ABNORMAL LOW (ref 3.77–5.28)
RDW: 12.3 % (ref 11.7–15.4)
WBC: 11 x10E3/uL — ABNORMAL HIGH (ref 3.4–10.8)

## 2024-01-10 LAB — GLUCOSE TOLERANCE, 2 HOURS W/ 1HR
Glucose, 1 hour: 92 mg/dL (ref 70–179)
Glucose, 2 hour: 91 mg/dL (ref 70–152)
Glucose, Fasting: 80 mg/dL (ref 70–91)

## 2024-01-10 LAB — HIV ANTIBODY (ROUTINE TESTING W REFLEX): HIV Screen 4th Generation wRfx: NONREACTIVE

## 2024-01-10 LAB — SYPHILIS: RPR W/REFLEX TO RPR TITER AND TREPONEMAL ANTIBODIES, TRADITIONAL SCREENING AND DIAGNOSIS ALGORITHM: RPR Ser Ql: NONREACTIVE

## 2024-01-16 ENCOUNTER — Encounter: Payer: Self-pay | Admitting: Obstetrics and Gynecology

## 2024-01-22 NOTE — Progress Notes (Unsigned)
   PRENATAL VISIT NOTE  Subjective:  Abigail Patterson is a 23 y.o. G1P0 at [redacted]w[redacted]d being seen today for ongoing prenatal care.  She is currently monitored for the following issues for this {Blank single:19197::"high-risk","low-risk"} pregnancy and has Supervision of other normal pregnancy, antepartum; Anemia affecting pregnancy; [redacted] weeks gestation of pregnancy; and Encounter for supervision of low-risk first pregnancy in second trimester on their problem list.  Patient reports {sx:14538}.   .  .   . Denies leaking of fluid.   The following portions of the patient's history were reviewed and updated as appropriate: allergies, current medications, past family history, past medical history, past social history, past surgical history and problem list.   Objective:  There were no vitals filed for this visit.  Fetal Status:           General:  Alert, oriented and cooperative. Patient is in no acute distress.  Skin: Skin is warm and dry. No rash noted.   Cardiovascular: Normal heart rate noted  Respiratory: Normal respiratory effort, no problems with respiration noted  Abdomen: Soft, gravid, appropriate for gestational age.        Pelvic: {Blank single:19197::"Cervical exam performed in the presence of a chaperone","Cervical exam deferred"}        Extremities: Normal range of motion.     Mental Status: Normal mood and affect. Normal behavior. Normal judgment and thought content.   Assessment and Plan:  Pregnancy: G1P0 at [redacted]w[redacted]d 1. Encounter for supervision of low-risk first pregnancy in third trimester (Primary) ***  2. [redacted] weeks gestation of pregnancy ***  {Blank single:19197::"Term","Preterm"} labor symptoms and general obstetric precautions including but not limited to vaginal bleeding, contractions, leaking of fluid and fetal movement were reviewed in detail with the patient. Please refer to After Visit Summary for other counseling recommendations.   Return in about 2 weeks (around 02/06/2024)  for LOB, with CNM please.  Future Appointments  Date Time Provider Department Center  01/23/2024  2:35 PM [facility]    Richardson Landry, CNM

## 2024-01-23 ENCOUNTER — Ambulatory Visit (INDEPENDENT_AMBULATORY_CARE_PROVIDER_SITE_OTHER): Admitting: Certified Nurse Midwife

## 2024-01-23 ENCOUNTER — Other Ambulatory Visit: Payer: Self-pay

## 2024-01-23 VITALS — BP 99/68 | HR 96 | Wt 165.2 lb

## 2024-01-23 DIAGNOSIS — N949 Unspecified condition associated with female genital organs and menstrual cycle: Secondary | ICD-10-CM | POA: Diagnosis not present

## 2024-01-23 DIAGNOSIS — Z3A29 29 weeks gestation of pregnancy: Secondary | ICD-10-CM

## 2024-01-23 DIAGNOSIS — Z3403 Encounter for supervision of normal first pregnancy, third trimester: Secondary | ICD-10-CM | POA: Diagnosis not present

## 2024-01-23 NOTE — Patient Instructions (Signed)
 Round Ligament Pain During Pregnancy Round ligament pain is a sharp pain or jabbing feeling often felt in the lower belly or groin area on one or both sides. It is one of the most common complaints during pregnancy and is considered a normal part of pregnancy. It is most often felt during the second trimester.  Here is what you need to know about round ligament pain, including some tips to help you feel better.  Causes of Round Ligament Pain  Several thick ligaments surround and support your womb (uterus) as it grows during pregnancy. One of them is called the round ligament.  The round ligament connects the front part of the womb to your groin, the area where your legs attach to your pelvis. The round ligament normally tightens and relaxes slowly.  As your baby and womb grow, the round ligament stretches. That makes it more likely to become strained.  Sudden movements can cause the ligament to tighten quickly, like a rubber band snapping. This causes a sudden and quick jabbing feeling.  Symptoms of Round Ligament Pain  Round ligament pain can be concerning and uncomfortable. But it is considered normal as your body changes during pregnancy.  The symptoms of round ligament pain include a sharp, sudden spasm in the belly. It usually affects the right side, but it may happen on both sides. The pain only lasts a few seconds.  Exercise may cause the pain, as will rapid movements such as:  sneezing coughing laughing rolling over in bed standing up too quickly  Comfort Measures:  Soak in a warm tub Tylenol 1000 mg by mouth every 6-8 hrs as needed for pain Slow position changes Laying on the affected side    Massage: Starting at the middle of your pubic bone, trace little circles in a wide U from your pubic bone to your hip bones on both sides.  Then starting just above your pubic bone, press in and down, alternating sides to create a gentle rocking of your uterus back and forth.   Move your hands up the sides of your belly and back down. Do this 3-5 times upon waking and before bed.   Stretches: Get on hands and knees and alternate arching your back deeply while inhaling, and then rounding your back while exhaling. Modified runners lunge:  - Sit on a chair with half of your bottom on the chair and half off.  - Sit up tall, plant your front foot, and stretch your other foot out behind you.  - Breathe deeply for 5 breaths and then do the other side.   Also chiropractors and massages are safe in pregnancy. [facility] in [place] specializes in pregnancy care. You can visit their website at: [email or web address] to schedule a new patient chiropractic treatment (1 hour) appointment. Please let us know if you have any other questions or concerns.  Maternity Support [employer] can purchase on [place] or through [place]. Below is an example.

## 2024-02-06 ENCOUNTER — Ambulatory Visit: Admitting: Certified Nurse Midwife

## 2024-02-06 ENCOUNTER — Other Ambulatory Visit: Payer: Self-pay

## 2024-02-06 VITALS — BP 111/69 | HR 96 | Wt 167.5 lb

## 2024-02-06 DIAGNOSIS — Z3403 Encounter for supervision of normal first pregnancy, third trimester: Secondary | ICD-10-CM

## 2024-02-06 DIAGNOSIS — N949 Unspecified condition associated with female genital organs and menstrual cycle: Secondary | ICD-10-CM

## 2024-02-06 DIAGNOSIS — Z3A31 31 weeks gestation of pregnancy: Secondary | ICD-10-CM | POA: Diagnosis not present

## 2024-02-06 NOTE — Patient Instructions (Signed)
 Things to Try After 37 weeks to Encourage Labor/Get Ready for Labor:    Try the [employer] at [email or web address] daily to improve baby's position and encourage the onset of labor.  Walk a little and rest a little every day.  Change positions often.  Cervical Ripening: May try one or both Red Raspberry Leaf capsules or tea:  two 300mg  or 400mg  tablets with each meal, 2-3 times a day, or 1-3 cups of tea daily  Potential Side Effects Of Raspberry Leaf:  Most women do not experience any side effects from drinking raspberry leaf tea. However, nausea and loose stools are possible.  Evening Primrose Oil capsules: take 1 capsule by mouth and place one capsule in the vagina every night.    Some of the potential side effects:  Upset stomach  Loose stools or diarrhea  Headaches  Nausea  Sex can also help the cervix ripen and encourage labor onset.  5. Eating 6-8 dates per day can help soften and even dilate your cervix. You may eat them plain, in smoothies, or in energy balls.  They do contain sugar so eat with caution and balance with protein.

## 2024-02-06 NOTE — Progress Notes (Signed)
   PRENATAL VISIT NOTE  Subjective:  Abigail Patterson is a 23 y.o. G1P0 at [redacted]w[redacted]d being seen today for ongoing prenatal care.  She is currently monitored for the following issues for this low-risk pregnancy and has Supervision of other normal pregnancy, antepartum; Anemia affecting pregnancy; [redacted] weeks gestation of pregnancy; and Encounter for supervision of low-risk first pregnancy in second trimester on their problem list.  Patient reports  round ligament pain .  Contractions: Not present. Vag. Bleeding: None.  Movement: Present. Denies leaking of fluid.   The following portions of the patient's history were reviewed and updated as appropriate: allergies, current medications, past family history, past medical history, past social history, past surgical history and problem list.   Objective:   Vitals:   02/06/24 1337  BP: 111/69  Pulse: 96  Weight: 167 lb 8 oz (76 kg)    Fetal Status: Fetal Heart Rate (bpm): 138 Fundal Height: 31 cm Movement: Present     General:  Alert, oriented and cooperative. Patient is in no acute distress.  Skin: Skin is warm and dry. No rash noted.   Cardiovascular: Normal heart rate noted  Respiratory: Normal respiratory effort, no problems with respiration noted  Abdomen: Soft, gravid, appropriate for gestational age.  Pain/Pressure: Present     Pelvic: Cervical exam deferred        Extremities: Normal range of motion.  Edema: Trace  Mental Status: Normal mood and affect. Normal behavior. Normal judgment and thought content.   Assessment and Plan:  Pregnancy: G1P0 at [redacted]w[redacted]d 1. Encounter for supervision of low-risk first pregnancy in third trimester (Primary) - Doing well, feeling regular and vigorous fetal movement' - Patient no longer interested in waterbirth. Strongly recommend investigating her options.   2. [redacted] weeks gestation of pregnancy - FH appropriate, anticipatory guidance regarding upcoming appointments, education opportunities and labor readiness and  birth plan counseled.   3. Round ligament pain - Massage and stretches counseled.   Preterm labor symptoms and general obstetric precautions including but not limited to vaginal bleeding, contractions, leaking of fluid and fetal movement were reviewed in detail with the patient. Please refer to After Visit Summary for other counseling recommendations.   Return in about 2 weeks (around 02/20/2024) for LOB, with CNM .  Future Appointments  Date Time Provider Department Center  02/20/2024 10:15 AM Tari Fare, CNM [facility]    Salomon Cree, CNM

## 2024-02-20 ENCOUNTER — Encounter: Admitting: Certified Nurse Midwife

## 2024-02-22 ENCOUNTER — Other Ambulatory Visit: Payer: Self-pay

## 2024-02-22 ENCOUNTER — Encounter: Payer: Self-pay | Admitting: Family Medicine

## 2024-02-22 ENCOUNTER — Ambulatory Visit: Admitting: Family Medicine

## 2024-02-22 VITALS — BP 99/62 | HR 103 | Wt 170.6 lb

## 2024-02-22 DIAGNOSIS — Z3483 Encounter for supervision of other normal pregnancy, third trimester: Secondary | ICD-10-CM

## 2024-02-22 DIAGNOSIS — Z3A34 34 weeks gestation of pregnancy: Secondary | ICD-10-CM | POA: Diagnosis not present

## 2024-02-22 DIAGNOSIS — Z3480 Encounter for supervision of other normal pregnancy, unspecified trimester: Secondary | ICD-10-CM

## 2024-02-22 NOTE — Progress Notes (Signed)
   Subjective:  Dayleen Adolphe is a 23 y.o. G1P0 at [redacted]w[redacted]d being seen today for ongoing prenatal care.  She is currently monitored for the following issues for this low-risk pregnancy and has Supervision of other normal pregnancy, antepartum and Encounter for supervision of low-risk first pregnancy in second trimester on their problem list.  Patient reports no complaints.  Contractions: Irritability. Vag. Bleeding: None.  Movement: Present. Denies leaking of fluid.   The following portions of the patient's history were reviewed and updated as appropriate: allergies, current medications, past family history, past medical history, past social history, past surgical history and problem list. Problem list updated.  Objective:   Vitals:   02/22/24 1441  BP: 99/62  Pulse: (!) 103  Weight: 170 lb 9.6 oz (77.4 kg)    Fetal Status: Fetal Heart Rate (bpm): 133   Movement: Present     General:  Alert, oriented and cooperative. Patient is in no acute distress.  Skin: Skin is warm and dry. No rash noted.   Cardiovascular: Normal heart rate noted  Respiratory: Normal respiratory effort, no problems with respiration noted  Abdomen: Soft, gravid, appropriate for gestational age. Pain/Pressure: Present     Pelvic: Vag. Bleeding: None     Cervical exam deferred        Extremities: Normal range of motion.  Edema: Trace  Mental Status: Normal mood and affect. Normal behavior. Normal judgment and thought content.   Urinalysis:      Assessment and Plan:  Pregnancy: G1P0 at [redacted]w[redacted]d  1. Supervision of other normal pregnancy, antepartum (Primary) BP and FHR normal FH normal Discussed contraception at length, referred to bedsider as she is still undecided Also reviewed labor precautions  Preterm labor symptoms and general obstetric precautions including but not limited to vaginal bleeding, contractions, leaking of fluid and fetal movement were reviewed in detail with the patient. Please refer to After Visit  Summary for other counseling recommendations.  Return in 2 weeks (on 03/07/2024) for [facility], ob visit.   Teena Feast, MD

## 2024-02-22 NOTE — Patient Instructions (Signed)

## 2024-03-07 ENCOUNTER — Encounter (HOSPITAL_COMMUNITY): Payer: Self-pay | Admitting: Obstetrics and Gynecology

## 2024-03-07 ENCOUNTER — Other Ambulatory Visit: Payer: Self-pay

## 2024-03-07 ENCOUNTER — Inpatient Hospital Stay (HOSPITAL_COMMUNITY)
Admission: AD | Admit: 2024-03-07 | Discharge: 2024-03-07 | Disposition: A | Discharge status: Home / Self Care | Attending: Obstetrics and Gynecology | Admitting: Obstetrics and Gynecology

## 2024-03-07 ENCOUNTER — Telehealth

## 2024-03-07 DIAGNOSIS — O4703 False labor before 37 completed weeks of gestation, third trimester: Secondary | ICD-10-CM | POA: Insufficient documentation

## 2024-03-07 DIAGNOSIS — Z3A36 36 weeks gestation of pregnancy: Secondary | ICD-10-CM | POA: Diagnosis not present

## 2024-03-07 DIAGNOSIS — O479 False labor, unspecified: Secondary | ICD-10-CM

## 2024-03-07 DIAGNOSIS — Z3689 Encounter for other specified antenatal screening: Secondary | ICD-10-CM

## 2024-03-07 LAB — URINALYSIS, ROUTINE W REFLEX MICROSCOPIC
Bilirubin Urine: NEGATIVE
Glucose, UA: NEGATIVE mg/dL
Hgb urine dipstick: NEGATIVE
Ketones, ur: NEGATIVE mg/dL
Leukocytes,Ua: NEGATIVE
Nitrite: NEGATIVE
Protein, ur: NEGATIVE mg/dL
Specific Gravity, Urine: 1.013 (ref 1.005–1.030)
pH: 6 (ref 5.0–8.0)

## 2024-03-07 NOTE — MAU Provider Note (Signed)
 S: [name] is a 23 y.o. G1P0 at [redacted]w[redacted]d  who presents to MAU today for labor evaluation.     Cervical exam by RN:  Dilation: Closed Effacement (%): Thick Cervical Position: Posterior Station: Ballotable Exam by:: Carolyn Cisco  Fetal Monitoring: Baseline: 135 Variability: moderate Accelerations: present Decelerations: absent Contractions: rare  MDM Discussed patient with RN. NST reviewed.   A: SIUP at [redacted]w[redacted]d  False labor  P: Discharge home Labor precautions and kick counts included in AVS Patient to follow-up with primary OB as scheduled  Patient may return to MAU as needed or when in labor   Maud Sorenson, MD 03/07/2024 10:50 AM

## 2024-03-07 NOTE — MAU Note (Signed)
 Abigail Patterson is a 23 y.o. at [redacted]w[redacted]d here in MAU reporting: she's having irregular ctxs that began this morning @ 0130.  Denies VB or LOF.  Endorses +FM.  LMP: 06/29/2023 Onset of complaint: today Pain score: 6 Vitals:   03/07/24 0950  BP: 115/68  Pulse: 95  Resp: 18  Temp: 97.9 F (36.6 C)  SpO2: 99%     FHT: 158 bpm  Lab orders placed from triage: UA

## 2024-03-07 NOTE — MAU Note (Signed)
 I have communicated with Dr. Nolon Baxter and reviewed vital signs:  Vitals:   03/07/24 0950  BP: 115/68  Pulse: 95  Resp: 18  Temp: 97.9 F (36.6 C)  SpO2: 99%    Vaginal exam:  Dilation: Closed Effacement (%): Thick Cervical Position: Posterior Station: Ballotable Exam by:: Carolyn Cisco,   Also reviewed contraction pattern and that non-stress test is reactive.  It has been documented that patient is having occasional irregular contractions. SVE closed, long and posterior not indicating active labor.  Patient denies any other complaints.  Based on this report provider has given order for discharge.  A discharge order and diagnosis entered by a provider.   Labor discharge instructions reviewed with patient.

## 2024-03-12 ENCOUNTER — Other Ambulatory Visit (HOSPITAL_COMMUNITY)
Admission: RE | Admit: 2024-03-12 | Discharge: 2024-03-12 | Disposition: A | Discharge status: Home / Self Care | Source: Ambulatory Visit | Attending: Certified Nurse Midwife | Admitting: Certified Nurse Midwife

## 2024-03-12 ENCOUNTER — Ambulatory Visit: Admitting: Certified Nurse Midwife

## 2024-03-12 ENCOUNTER — Other Ambulatory Visit: Payer: Self-pay

## 2024-03-12 VITALS — BP 117/82 | HR 99 | Wt 179.4 lb

## 2024-03-12 DIAGNOSIS — Z3A36 36 weeks gestation of pregnancy: Secondary | ICD-10-CM

## 2024-03-12 DIAGNOSIS — Z3403 Encounter for supervision of normal first pregnancy, third trimester: Secondary | ICD-10-CM

## 2024-03-12 NOTE — Patient Instructions (Signed)
 Things to Try After 37 weeks to Encourage Labor/Get Ready for Labor:    Try the [employer] at [email or web address] daily to improve baby's position and encourage the onset of labor.  Walk a little and rest a little every day.  Change positions often.  Cervical Ripening: May try one or both Red Raspberry Leaf capsules or tea:  two 300mg  or 400mg  tablets with each meal, 2-3 times a day, or 1-3 cups of tea daily  Potential Side Effects Of Raspberry Leaf:  Most women do not experience any side effects from drinking raspberry leaf tea. However, nausea and loose stools are possible.  Evening Primrose Oil capsules: take 1 capsule by mouth and place one capsule in the vagina every night.    Some of the potential side effects:  Upset stomach  Loose stools or diarrhea  Headaches  Nausea  Sex can also help the cervix ripen and encourage labor onset.  5. Eating 6-8 dates per day can help soften and even dilate your cervix. You may eat them plain, in smoothies, or in energy balls.  They do contain sugar so eat with caution and balance with protein.

## 2024-03-12 NOTE — Progress Notes (Unsigned)
   PRENATAL VISIT NOTE  Subjective:  Abigail Patterson is a 23 y.o. G1P0 at [redacted]w[redacted]d being seen today for ongoing prenatal care.  She is currently monitored for the following issues for this low-risk pregnancy and has Supervision of other normal pregnancy, antepartum and Encounter for supervision of low-risk first pregnancy in second trimester on their problem list.  Patient reports {sx:14538}.  Contractions: Irritability. Vag. Bleeding: None.  Movement: Present. Denies leaking of fluid.   The following portions of the patient's history were reviewed and updated as appropriate: allergies, current medications, past family history, past medical history, past social history, past surgical history and problem list.   Objective:    Vitals:   03/12/24 1500  BP: 117/82  Pulse: 99  Weight: 179 lb 6.4 oz (81.4 kg)    Fetal Status:  Fetal Heart Rate (bpm): 172   Movement: Present    General: Alert, oriented and cooperative. Patient is in no acute distress.  Skin: Skin is warm and dry. No rash noted.   Cardiovascular: Normal heart rate noted  Respiratory: Normal respiratory effort, no problems with respiration noted  Abdomen: Soft, gravid, appropriate for gestational age.  Pain/Pressure: Present     Pelvic: Cervical exam deferred        Extremities: Normal range of motion.  Edema: Trace  Mental Status: Normal mood and affect. Normal behavior. Normal judgment and thought content.   Assessment and Plan:  Pregnancy: G1P0 at [redacted]w[redacted]d 1. Encounter for supervision of low-risk first pregnancy in third trimester (Primary) ***  2. [redacted] weeks gestation of pregnancy ***  Preterm labor symptoms and general obstetric precautions including but not limited to vaginal bleeding, contractions, leaking of fluid and fetal movement were reviewed in detail with the patient. Please refer to After Visit Summary for other counseling recommendations.   Return in about 1 week (around 03/19/2024) for LOB.  Future  Appointments  Date Time Provider Department Center  03/19/2024 10:15 AM Curlie Doughty [facility]  03/26/2024 10:15 AM Cherlynn Cornfield, NP [facility]  04/02/2024 10:15 AM Zelma Hidden, FNP [facility]  04/09/2024 10:15 AM Zelma Hidden, FNP [facility]    Salomon Cree, CNM

## 2024-03-13 LAB — CERVICOVAGINAL ANCILLARY ONLY
Chlamydia: NEGATIVE
Comment: NEGATIVE
Comment: NORMAL
Neisseria Gonorrhea: NEGATIVE

## 2024-03-16 ENCOUNTER — Ambulatory Visit: Payer: Self-pay | Admitting: Certified Nurse Midwife

## 2024-03-16 LAB — CULTURE, BETA STREP (GROUP B ONLY): Strep Gp B Culture: POSITIVE — AB

## 2024-03-16 NOTE — Progress Notes (Unsigned)
   PRENATAL VISIT NOTE  Subjective:  Abigail Patterson is a 23 y.o. G1P0 at [redacted]w[redacted]d being seen today for ongoing prenatal care.  She is currently monitored for the following issues for this {Blank single:19197::"high-risk","low-risk"} pregnancy and has Supervision of other normal pregnancy, antepartum and Encounter for supervision of low-risk first pregnancy in second trimester on their problem list.  Patient reports {sx:14538}.   .  .   . Denies leaking of fluid.   The following portions of the patient's history were reviewed and updated as appropriate: allergies, current medications, past family history, past medical history, past social history, past surgical history and problem list.   Objective:    There were no vitals filed for this visit.  Fetal Status:           General: Alert, oriented and cooperative. Patient is in no acute distress.  Skin: Skin is warm and dry. No rash noted.   Cardiovascular: Normal heart rate noted  Respiratory: Normal respiratory effort, no problems with respiration noted  Abdomen: Soft, gravid, appropriate for gestational age.        Pelvic: {Blank single:19197::"Cervical exam performed in the presence of a chaperone","Cervical exam deferred"}        Extremities: Normal range of motion.     Mental Status: Normal mood and affect. Normal behavior. Normal judgment and thought content.   Assessment and Plan:  Pregnancy: G1P0 at [redacted]w[redacted]d 1. Encounter for supervision of low-risk first pregnancy in third trimester (Primary) ***  2. [redacted] weeks gestation of pregnancy ***  3. Positive GBS test ***  {Blank single:19197::"Term","Preterm"} labor symptoms and general obstetric precautions including but not limited to vaginal bleeding, contractions, leaking of fluid and fetal movement were reviewed in detail with the patient. Please refer to After Visit Summary for other counseling recommendations.   No follow-ups on file.  Future Appointments  Date Time Provider  Department Center  03/19/2024 10:15 AM Curlie Doughty [facility]  03/26/2024 10:15 AM Cherlynn Cornfield, NP [facility]  04/02/2024 10:15 AM Zelma Hidden, FNP [facility]  04/09/2024 10:15 AM Zelma Hidden, FNP [facility]    Salomon Cree, CNM

## 2024-03-19 ENCOUNTER — Ambulatory Visit: Payer: Self-pay | Admitting: Certified Nurse Midwife

## 2024-03-19 VITALS — BP 114/74 | HR 102 | Wt 178.8 lb

## 2024-03-19 DIAGNOSIS — B951 Streptococcus, group B, as the cause of diseases classified elsewhere: Secondary | ICD-10-CM

## 2024-03-19 DIAGNOSIS — O368130 Decreased fetal movements, third trimester, not applicable or unspecified: Secondary | ICD-10-CM | POA: Diagnosis not present

## 2024-03-19 DIAGNOSIS — Z3403 Encounter for supervision of normal first pregnancy, third trimester: Secondary | ICD-10-CM

## 2024-03-19 DIAGNOSIS — Z3A37 37 weeks gestation of pregnancy: Secondary | ICD-10-CM

## 2024-03-26 ENCOUNTER — Other Ambulatory Visit: Payer: Self-pay

## 2024-03-26 ENCOUNTER — Ambulatory Visit: Payer: Self-pay | Admitting: Nurse Practitioner

## 2024-03-26 VITALS — BP 110/73 | HR 86 | Wt 176.7 lb

## 2024-03-26 DIAGNOSIS — Z3A38 38 weeks gestation of pregnancy: Secondary | ICD-10-CM | POA: Diagnosis not present

## 2024-03-26 DIAGNOSIS — B951 Streptococcus, group B, as the cause of diseases classified elsewhere: Secondary | ICD-10-CM

## 2024-03-26 DIAGNOSIS — O99820 Streptococcus B carrier state complicating pregnancy: Secondary | ICD-10-CM

## 2024-03-26 DIAGNOSIS — Z3403 Encounter for supervision of normal first pregnancy, third trimester: Secondary | ICD-10-CM

## 2024-03-26 NOTE — Progress Notes (Signed)
   PRENATAL VISIT NOTE  Subjective:  Abigail Patterson is a 23 y.o. G1P0 at [redacted]w[redacted]d being seen today for ongoing prenatal care.  She is currently monitored for the following issues for this low-risk pregnancy and has Supervision of other normal pregnancy, antepartum on their problem list.  Patient reports no complaints.  Contractions: Irritability. Vag. Bleeding: None.  Movement: Present. Denies leaking of fluid.   The following portions of the patient's history were reviewed and updated as appropriate: allergies, current medications, past family history, past medical history, past social history, past surgical history and problem list.   Objective:   Vitals:   03/26/24 1026  BP: 110/73  Pulse: 86  Weight: 176 lb 11.2 oz (80.2 kg)    Fetal Status: Fetal Heart Rate (bpm): 134 Fundal Height: 39 cm Movement: Present     General:  Alert, oriented and cooperative. Patient is in no acute distress.  Skin: Skin is warm and dry. No rash noted.   Cardiovascular: Normal heart rate noted  Respiratory: Normal respiratory effort, no problems with respiration noted  Abdomen: Soft, gravid, appropriate for gestational age.  Pain/Pressure: Present     Pelvic: Cervical exam performed in the presence of a chaperone Dilation: Closed Effacement (%): Thick Station: Ballotable L/C/P   Extremities: Normal range of motion.  Edema: Trace  Mental Status: Normal mood and affect. Normal behavior. Normal judgment and thought content.   Assessment and Plan:  Pregnancy: G1P0 at [redacted]w[redacted]d  1. Encounter for supervision of low-risk first pregnancy in third trimester -Offers no complaints - FH appropriate - FHR appropriate  2. Positive GBS test - Will plan to treat intrapartum - Patient aware and verbalized understanding   3. [redacted] weeks gestation of pregnancy - irregular contractions - Cervical exam at patient request L/C/P -Labor precautions reviewed     Term labor symptoms and general obstetric precautions  including but not limited to vaginal bleeding, contractions, leaking of fluid and fetal movement were reviewed in detail with the patient. Please refer to After Visit Summary for other counseling recommendations.   No follow-ups on file.  Future Appointments  Date Time Provider Department Center  04/02/2024 10:15 AM Zelma Hidden, FNP [facility]  04/09/2024 10:15 AM Zelma Hidden, FNP [facility]    Cherlynn Cornfield, NP

## 2024-04-02 ENCOUNTER — Ambulatory Visit: Payer: Self-pay | Admitting: Obstetrics and Gynecology

## 2024-04-02 ENCOUNTER — Other Ambulatory Visit: Payer: Self-pay

## 2024-04-02 VITALS — BP 120/68 | HR 96 | Wt 181.1 lb

## 2024-04-02 DIAGNOSIS — Z3A39 39 weeks gestation of pregnancy: Secondary | ICD-10-CM

## 2024-04-02 DIAGNOSIS — O99820 Streptococcus B carrier state complicating pregnancy: Secondary | ICD-10-CM

## 2024-04-02 DIAGNOSIS — Z3403 Encounter for supervision of normal first pregnancy, third trimester: Secondary | ICD-10-CM

## 2024-04-02 DIAGNOSIS — Z3480 Encounter for supervision of other normal pregnancy, unspecified trimester: Secondary | ICD-10-CM

## 2024-04-02 DIAGNOSIS — B951 Streptococcus, group B, as the cause of diseases classified elsewhere: Secondary | ICD-10-CM

## 2024-04-02 NOTE — Progress Notes (Signed)
   PRENATAL VISIT NOTE  Subjective:  Abigail Patterson is a 23 y.o. G1P0 at [redacted]w[redacted]d being seen today for ongoing prenatal care.  She is currently monitored for the following issues for this low-risk pregnancy and has Supervision of other normal pregnancy, antepartum on their problem list.  Patient reports irregular contractions.  Contractions: Irregular. Vag. Bleeding: None.  Movement: Present. Denies leaking of fluid.   The following portions of the patient's history were reviewed and updated as appropriate: allergies, current medications, past family history, past medical history, past social history, past surgical history and problem list.   Objective:    Vitals:   04/02/24 1051  BP: 120/68  Pulse: 96  Weight: 181 lb 1.6 oz (82.1 kg)    Fetal Status:  Fetal Heart Rate (bpm): 143 Fundal Height: 39 cm Movement: Present Presentation: Vertex  General: Alert, oriented and cooperative. Patient is in no acute distress.  Skin: Skin is warm and dry. No rash noted.   Cardiovascular: Normal heart rate noted  Respiratory: Normal respiratory effort, no problems with respiration noted  Abdomen: Soft, gravid, appropriate for gestational age.  Pain/Pressure: Present     Pelvic: Cervical exam performed in the presence of a chaperone Dilation: Fingertip Effacement (%): 50 Station: -3  Extremities: Normal range of motion.  Edema: Trace  Mental Status: Normal mood and affect. Normal behavior. Normal judgment and thought content.   Assessment and Plan:  Pregnancy: G1P0 at [redacted]w[redacted]d 1. Encounter for supervision of low-risk first pregnancy in third trimester (Primary) BP and FHR normal Doing well, feeling regular movement    2. [redacted] weeks gestation of pregnancy Discussed option for membrane sweep, unable to perform today  Discussed trial next visit  Discussed scheduling IOL 41 weeks, she strongly desires no IOL, will wait for next visit  Discussed bpp next visit   3. Positive GBS test Tx in labor    Preterm labor symptoms and general obstetric precautions including but not limited to vaginal bleeding, contractions, leaking of fluid and fetal movement were reviewed in detail with the patient. Please refer to After Visit Summary for other counseling recommendations.   Return in one week for routine prenatal   Future Appointments  Date Time Provider Department Center  04/09/2024 10:15 AM Zelma Hidden, FNP [facility]  04/09/2024  1:55 PM [facility]    Susi Eric, FNP

## 2024-04-03 ENCOUNTER — Encounter (HOSPITAL_COMMUNITY): Payer: Self-pay | Admitting: Obstetrics & Gynecology

## 2024-04-03 ENCOUNTER — Inpatient Hospital Stay (HOSPITAL_COMMUNITY)
Admission: AD | Admit: 2024-04-03 | Discharge: 2024-04-06 | DRG: 807 | Disposition: A | Discharge status: Home / Self Care | Attending: Family Medicine | Admitting: Family Medicine

## 2024-04-03 ENCOUNTER — Inpatient Hospital Stay (HOSPITAL_COMMUNITY)
Admission: AD | Admit: 2024-04-03 | Discharge: 2024-04-03 | Disposition: A | Discharge status: Home / Self Care | Source: Home / Self Care | Attending: Obstetrics & Gynecology | Admitting: Obstetrics & Gynecology

## 2024-04-03 DIAGNOSIS — Z3A40 40 weeks gestation of pregnancy: Secondary | ICD-10-CM

## 2024-04-03 DIAGNOSIS — Z3689 Encounter for other specified antenatal screening: Secondary | ICD-10-CM

## 2024-04-03 DIAGNOSIS — O99824 Streptococcus B carrier state complicating childbirth: Secondary | ICD-10-CM | POA: Diagnosis present

## 2024-04-03 DIAGNOSIS — O134 Gestational [pregnancy-induced] hypertension without significant proteinuria, complicating childbirth: Principal | ICD-10-CM | POA: Diagnosis present

## 2024-04-03 DIAGNOSIS — Z3A39 39 weeks gestation of pregnancy: Secondary | ICD-10-CM | POA: Insufficient documentation

## 2024-04-03 DIAGNOSIS — O471 False labor at or after 37 completed weeks of gestation: Secondary | ICD-10-CM | POA: Insufficient documentation

## 2024-04-03 DIAGNOSIS — O479 False labor, unspecified: Secondary | ICD-10-CM

## 2024-04-03 DIAGNOSIS — Z8249 Family history of ischemic heart disease and other diseases of the circulatory system: Secondary | ICD-10-CM

## 2024-04-03 DIAGNOSIS — Z833 Family history of diabetes mellitus: Secondary | ICD-10-CM

## 2024-04-03 DIAGNOSIS — O99820 Streptococcus B carrier state complicating pregnancy: Secondary | ICD-10-CM

## 2024-04-03 MED ORDER — MORPHINE SULFATE (PF) 4 MG/ML IV SOLN
2.0000 mg | Freq: Once | INTRAVENOUS | Status: AC
  Administered 2024-04-03: 2 mg via INTRAMUSCULAR
  Filled 2024-04-03: qty 1

## 2024-04-03 MED ORDER — MORPHINE SULFATE (PF) 4 MG/ML IV SOLN
2.0000 mg | Freq: Once | INTRAVENOUS | Status: AC
  Administered 2024-04-03: 2 mg via SUBCUTANEOUS
  Filled 2024-04-03: qty 1

## 2024-04-03 NOTE — MAU Note (Signed)
 Abigail Patterson is a 23 y.o. at [redacted]w[redacted]d here in MAU reporting: having contractions. Now every 2-3 min, sometimes 5.   Very intense today. No bleeding or LOF. Reports+fm. 1cm when last checked,  Onset of complaint: 2 days Pain score: 7 Vitals:   04/03/24 0905  BP: 129/80  Pulse: 99  Resp: 17  Temp: 98.9 F (37.2 C)  SpO2: 99%     FHT:153 Lab orders placed from triage:

## 2024-04-03 NOTE — MAU Provider Note (Signed)
 Labor Check     S: [name] is a 23 y.o. G1P0 at [redacted]w[redacted]d  who presents to MAU today complaining contractions q 2-5 minutes since yesterday. She denies vaginal bleeding. She denies LOF. She reports normal fetal movement.    O: BP 129/80 (BP Location: Right Arm)   Pulse 99   Temp 98.9 F (37.2 C) (Oral)   Resp 17   Ht 5' 2 (1.575 m)   Wt 82.1 kg   LMP 06/29/2023   SpO2 99%   BMI 33.09 kg/m  GENERAL: Well-developed, well-nourished female in no acute distress.  HEAD: Normocephalic, atraumatic.  CHEST: Normal effort of breathing, regular heart rate ABDOMEN: Soft, nontender, gravid  Cervical exam:  Dilation: 3 Effacement (%): 100 Station: -2 Presentation: Vertex Exam by:: Mickeal Aland, RN   Fetal Monitoring: Baseline: 120bpm Variability: moderate  Accelerations: + Decelerations: none Contractions: q27mins  SVE 2/100/-2 > 3/100-2  Pt request pain medication prior to d/c as well as being added to eIOL.  Was added to 6/19 eIOL, pt understands may not be called in and that she is fourth in line for said list. Pt given Morphine 2g IM and 2g SubQ. Pt stable for d/c.   A: SIUP at [redacted]w[redacted]d  False labor  P: Strict/usual return precautions.  Added to eIOL 04/03/24  Ebony Goldstein, MD 04/03/2024 11:13 AM

## 2024-04-03 NOTE — Discharge Instructions (Signed)
 Added to eIOL 04/03/24

## 2024-04-04 ENCOUNTER — Inpatient Hospital Stay (HOSPITAL_COMMUNITY): Admitting: Anesthesiology

## 2024-04-04 ENCOUNTER — Other Ambulatory Visit: Payer: Self-pay

## 2024-04-04 ENCOUNTER — Encounter (HOSPITAL_COMMUNITY): Payer: Self-pay | Admitting: Obstetrics & Gynecology

## 2024-04-04 DIAGNOSIS — O26893 Other specified pregnancy related conditions, third trimester: Secondary | ICD-10-CM | POA: Diagnosis present

## 2024-04-04 DIAGNOSIS — O99820 Streptococcus B carrier state complicating pregnancy: Secondary | ICD-10-CM | POA: Diagnosis not present

## 2024-04-04 DIAGNOSIS — O479 False labor, unspecified: Secondary | ICD-10-CM | POA: Diagnosis not present

## 2024-04-04 DIAGNOSIS — O99824 Streptococcus B carrier state complicating childbirth: Secondary | ICD-10-CM | POA: Diagnosis present

## 2024-04-04 DIAGNOSIS — Z8249 Family history of ischemic heart disease and other diseases of the circulatory system: Secondary | ICD-10-CM | POA: Diagnosis not present

## 2024-04-04 DIAGNOSIS — O480 Post-term pregnancy: Secondary | ICD-10-CM | POA: Diagnosis not present

## 2024-04-04 DIAGNOSIS — Z3A39 39 weeks gestation of pregnancy: Secondary | ICD-10-CM | POA: Diagnosis not present

## 2024-04-04 DIAGNOSIS — O134 Gestational [pregnancy-induced] hypertension without significant proteinuria, complicating childbirth: Secondary | ICD-10-CM | POA: Diagnosis not present

## 2024-04-04 DIAGNOSIS — Z833 Family history of diabetes mellitus: Secondary | ICD-10-CM | POA: Diagnosis not present

## 2024-04-04 DIAGNOSIS — Z3A40 40 weeks gestation of pregnancy: Secondary | ICD-10-CM

## 2024-04-04 LAB — CBC
HCT: 41.9 % (ref 36.0–46.0)
Hemoglobin: 14.7 g/dL (ref 12.0–15.0)
MCH: 35.4 pg — ABNORMAL HIGH (ref 26.0–34.0)
MCHC: 35.1 g/dL (ref 30.0–36.0)
MCV: 101 fL — ABNORMAL HIGH (ref 80.0–100.0)
Platelets: 254 10*3/uL (ref 150–400)
RBC: 4.15 MIL/uL (ref 3.87–5.11)
RDW: 12.5 % (ref 11.5–15.5)
WBC: 14 10*3/uL — ABNORMAL HIGH (ref 4.0–10.5)
nRBC: 0 % (ref 0.0–0.2)

## 2024-04-04 LAB — TYPE AND SCREEN
ABO/RH(D): O POS
Antibody Screen: NEGATIVE

## 2024-04-04 LAB — RPR: RPR Ser Ql: NONREACTIVE

## 2024-04-04 MED ORDER — SENNOSIDES-DOCUSATE SODIUM 8.6-50 MG PO TABS
2.0000 | ORAL_TABLET | Freq: Every day | ORAL | Status: DC
  Administered 2024-04-05 – 2024-04-06 (×2): 2 via ORAL
  Filled 2024-04-04 (×2): qty 2

## 2024-04-04 MED ORDER — LACTATED RINGERS IV SOLN
500.0000 mL | Freq: Once | INTRAVENOUS | Status: AC
Start: 2024-04-04 — End: 2024-04-04
  Administered 2024-04-04: 500 mL via INTRAVENOUS

## 2024-04-04 MED ORDER — SODIUM CHLORIDE 0.9 % IV SOLN
5.0000 10*6.[IU] | Freq: Once | INTRAVENOUS | Status: AC
  Administered 2024-04-04: 5 10*6.[IU] via INTRAVENOUS
  Filled 2024-04-04: qty 5

## 2024-04-04 MED ORDER — BENZOCAINE-MENTHOL 20-0.5 % EX AERO
1.0000 | INHALATION_SPRAY | CUTANEOUS | Status: DC | PRN
  Administered 2024-04-04: 1 via TOPICAL
  Filled 2024-04-04: qty 56

## 2024-04-04 MED ORDER — OXYTOCIN-SODIUM CHLORIDE 30-0.9 UT/500ML-% IV SOLN: 2.5000 [IU]/h | INTRAVENOUS | Status: DC

## 2024-04-04 MED ORDER — FENTANYL CITRATE (PF) 100 MCG/2ML IJ SOLN: 50.0000 ug | INTRAMUSCULAR | Status: DC | PRN

## 2024-04-04 MED ORDER — WITCH HAZEL-GLYCERIN EX PADS: 1.0000 | MEDICATED_PAD | CUTANEOUS | Status: DC | PRN

## 2024-04-04 MED ORDER — SOD CITRATE-CITRIC ACID 500-334 MG/5ML PO SOLN: 30.0000 mL | ORAL | Status: DC | PRN

## 2024-04-04 MED ORDER — FENTANYL-BUPIVACAINE-NACL 0.5-0.125-0.9 MG/250ML-% EP SOLN
12.0000 mL/h | EPIDURAL | Status: DC | PRN
  Administered 2024-04-04: 12 mL/h via EPIDURAL
  Filled 2024-04-04: qty 250

## 2024-04-04 MED ORDER — ZOLPIDEM TARTRATE 5 MG PO TABS: 5.0000 mg | ORAL_TABLET | Freq: Every evening | ORAL | Status: DC | PRN

## 2024-04-04 MED ORDER — OXYTOCIN BOLUS FROM INFUSION: 333.0000 mL | Freq: Once | INTRAVENOUS | Status: DC

## 2024-04-04 MED ORDER — TETANUS-DIPHTH-ACELL PERTUSSIS 5-2.5-18.5 LF-MCG/0.5 IM SUSY
0.5000 mL | PREFILLED_SYRINGE | Freq: Once | INTRAMUSCULAR | Status: AC
Start: 2024-04-05 — End: ?

## 2024-04-04 MED ORDER — PENICILLIN G POT IN DEXTROSE 60000 UNIT/ML IV SOLN
3.0000 10*6.[IU] | INTRAVENOUS | Status: DC
  Administered 2024-04-04 (×2): 3 10*6.[IU] via INTRAVENOUS
  Filled 2024-04-04 (×2): qty 50

## 2024-04-04 MED ORDER — ACETAMINOPHEN 325 MG PO TABS
650.0000 mg | ORAL_TABLET | ORAL | Status: DC | PRN
  Administered 2024-04-05: 650 mg via ORAL
  Filled 2024-04-04: qty 2

## 2024-04-04 MED ORDER — COCONUT OIL OIL: 1.0000 | TOPICAL_OIL | Status: DC | PRN

## 2024-04-04 MED ORDER — DIBUCAINE (PERIANAL) 1 % EX OINT: 1.0000 | TOPICAL_OINTMENT | CUTANEOUS | Status: DC | PRN

## 2024-04-04 MED ORDER — LACTATED RINGERS IV SOLN: 500.0000 mL | INTRAVENOUS | Status: DC | PRN

## 2024-04-04 MED ORDER — TERBUTALINE SULFATE 1 MG/ML IJ SOLN: 0.2500 mg | Freq: Once | INTRAMUSCULAR | Status: DC | PRN

## 2024-04-04 MED ORDER — OXYCODONE-ACETAMINOPHEN 5-325 MG PO TABS: 1.0000 | ORAL_TABLET | ORAL | Status: DC | PRN

## 2024-04-04 MED ORDER — DIPHENHYDRAMINE HCL 25 MG PO CAPS: 25.0000 mg | ORAL_CAPSULE | Freq: Four times a day (QID) | ORAL | Status: DC | PRN

## 2024-04-04 MED ORDER — ONDANSETRON HCL 4 MG/2ML IJ SOLN: 4.0000 mg | INTRAMUSCULAR | Status: DC | PRN

## 2024-04-04 MED ORDER — EPHEDRINE 5 MG/ML INJ: 10.0000 mg | INTRAVENOUS | Status: DC | PRN

## 2024-04-04 MED ORDER — LIDOCAINE HCL (PF) 1 % IJ SOLN
INTRAMUSCULAR | Status: DC | PRN
  Administered 2024-04-04: 10 mL via EPIDURAL

## 2024-04-04 MED ORDER — ACETAMINOPHEN 325 MG PO TABS: 650.0000 mg | ORAL_TABLET | ORAL | Status: DC | PRN

## 2024-04-04 MED ORDER — LACTATED RINGERS IV SOLN: INTRAVENOUS | Status: DC

## 2024-04-04 MED ORDER — TRANEXAMIC ACID-NACL 1000-0.7 MG/100ML-% IV SOLN
INTRAVENOUS | Status: AC
  Administered 2024-04-04: 1000 mg
  Filled 2024-04-04: qty 100

## 2024-04-04 MED ORDER — SIMETHICONE 80 MG PO CHEW: 80.0000 mg | CHEWABLE_TABLET | ORAL | Status: DC | PRN

## 2024-04-04 MED ORDER — LIDOCAINE HCL (PF) 1 % IJ SOLN
30.0000 mL | INTRAMUSCULAR | Status: DC | PRN
  Filled 2024-04-04: qty 30

## 2024-04-04 MED ORDER — ONDANSETRON HCL 4 MG PO TABS
4.0000 mg | ORAL_TABLET | ORAL | Status: AC | PRN
Start: 2024-04-04 — End: ?

## 2024-04-04 MED ORDER — OXYTOCIN-SODIUM CHLORIDE 30-0.9 UT/500ML-% IV SOLN
1.0000 m[IU]/min | INTRAVENOUS | Status: DC
  Administered 2024-04-04: 2 m[IU]/min via INTRAVENOUS
  Filled 2024-04-04: qty 500

## 2024-04-04 MED ORDER — PHENYLEPHRINE 80 MCG/ML (10ML) SYRINGE FOR IV PUSH (FOR BLOOD PRESSURE SUPPORT): 80.0000 ug | PREFILLED_SYRINGE | INTRAVENOUS | Status: DC | PRN

## 2024-04-04 MED ORDER — HYDROXYZINE HCL 50 MG PO TABS: 50.0000 mg | ORAL_TABLET | Freq: Four times a day (QID) | ORAL | Status: DC | PRN

## 2024-04-04 MED ORDER — ONDANSETRON HCL 4 MG/2ML IJ SOLN: 4.0000 mg | Freq: Four times a day (QID) | INTRAMUSCULAR | Status: DC | PRN

## 2024-04-04 MED ORDER — OXYCODONE-ACETAMINOPHEN 5-325 MG PO TABS: 2.0000 | ORAL_TABLET | ORAL | Status: DC | PRN

## 2024-04-04 MED ORDER — DIPHENHYDRAMINE HCL 50 MG/ML IJ SOLN: 12.5000 mg | INTRAMUSCULAR | Status: DC | PRN

## 2024-04-04 MED ORDER — IBUPROFEN 600 MG PO TABS
600.0000 mg | ORAL_TABLET | Freq: Four times a day (QID) | ORAL | Status: DC
  Administered 2024-04-04 – 2024-04-06 (×8): 600 mg via ORAL
  Filled 2024-04-04 (×8): qty 1

## 2024-04-04 MED ORDER — PRENATAL MULTIVITAMIN CH
1.0000 | ORAL_TABLET | Freq: Every day | ORAL | Status: DC
  Administered 2024-04-05 – 2024-04-06 (×2): 1 via ORAL
  Filled 2024-04-04 (×2): qty 1

## 2024-04-04 MED ORDER — TRANEXAMIC ACID-NACL 1000-0.7 MG/100ML-% IV SOLN: 1000.0000 mg | INTRAVENOUS | Status: AC

## 2024-04-04 NOTE — Anesthesia Postprocedure Evaluation (Signed)
 Anesthesia Post Note  Patient: Abigail Patterson  Procedure(s) Performed: AN AD HOC LABOR EPIDURAL     Patient location during evaluation: Mother Baby Anesthesia Type: Epidural Level of consciousness: awake and alert Pain management: pain level controlled Vital Signs Assessment: post-procedure vital signs reviewed and stable Respiratory status: spontaneous breathing, nonlabored ventilation and respiratory function stable Cardiovascular status: stable Postop Assessment: no headache, no backache and epidural receding Anesthetic complications: no   No notable events documented.  Last Vitals:  Vitals:   04/04/24 1500 04/04/24 1600  BP: 110/70 115/72  Pulse: (!) 107 (!) 101  Resp: 18 20  Temp: 36.9 C 36.8 C  SpO2: 99%     Last Pain:  Vitals:   04/04/24 1806  TempSrc:   PainSc: 0-No pain   Pain Goal:                   Ja Ohman

## 2024-04-04 NOTE — Progress Notes (Signed)
 LABOR PROGRESS NOTE  Patient Name: Abigail Patterson, female   DOB: 2001-03-10, 23 y.o.  MRN: [id number]  Cat 1 strip, now adequate from GBS ppx PCN, comfortable with epidural, agreeable to AROM.  AROM, clear fluid.  Mom and baby tolerated well.  SVE ant-lip after AROM. Starting pitocin given spaced contractions.    Ebony Goldstein, MD

## 2024-04-04 NOTE — MAU Note (Signed)
 MAU Labor Triage Note:  .Abigail Patterson is a 23 y.o. at [redacted]w[redacted]d here in MAU reporting:  Contractions every: 3 minutes Onset of ctx: days Pain Score: 8  Pain Location: Abdomen  ROM: intact Vaginal Bleeding: none Last SVE: 3cm  Labor Pain Management Plan: Planning epidural  GBS: Positive  Fetal Movement: Reports positive FM FHT:    Vitals:   04/04/24 0014 04/04/24 0015  BP:  131/77  Pulse:  (!) 102  Resp:  20  Temp:  97.6 F (36.4 C)  SpO2: 100%        Lab orders placed from triage: MAU Labor Eval, appropriate provider to be notified based on nursing assessment. OB Office: Faculty

## 2024-04-04 NOTE — Anesthesia Procedure Notes (Signed)
 Epidural Patient location during procedure: OB Start time: 04/04/2024 2:05 AM End time: 04/04/2024 2:15 AM  Staffing Anesthesiologist: Grace Laura, MD Performed: anesthesiologist   Preanesthetic Checklist Completed: patient identified, IV checked, risks and benefits discussed, monitors and equipment checked, pre-op evaluation and timeout performed  Epidural Patient position: sitting Prep: DuraPrep and site prepped and draped Patient monitoring: continuous pulse ox, blood pressure, heart rate and cardiac monitor Approach: midline Location: L3-L4 Injection technique: LOR air  Needle:  Needle type: Tuohy  Needle gauge: 17 G Needle length: 9 cm Needle insertion depth: 6 cm Catheter type: closed end flexible Catheter size: 19 Gauge Catheter at skin depth: 11 cm Test dose: negative  Assessment Sensory level: T8 Events: blood not aspirated, no cerebrospinal fluid, injection not painful, no injection resistance, no paresthesia and negative IV test  Additional Notes Patient identified. Risks/Benefits/Options discussed with patient including but not limited to bleeding, infection, nerve damage, paralysis, failed block, incomplete pain control, headache, blood pressure changes, nausea, vomiting, reactions to medication both or allergic, itching and postpartum back pain. Confirmed with bedside nurse the patient's most recent platelet count. Confirmed with patient that they are not currently taking any anticoagulation, have any bleeding history or any family history of bleeding disorders. Patient expressed understanding and wished to proceed. All questions were answered. Sterile technique was used throughout the entire procedure. Please see nursing notes for vital signs. Test dose was given through epidural catheter and negative prior to continuing to dose epidural or start infusion. Warning signs of high block given to the patient including shortness of breath, tingling/numbness in hands,  complete motor block, or any concerning symptoms with instructions to call for help. Patient was given instructions on fall risk and not to get out of bed. All questions and concerns addressed with instructions to call with any issues or inadequate analgesia.  Reason for block:procedure for pain

## 2024-04-04 NOTE — Discharge Summary (Signed)
     Postpartum Discharge Summary  Date of Service updated***     Patient Name: Abigail Patterson DOB: Jan 25, 2001 MRN: [id number]  Date of admission: 04/03/2024 Delivery date:04/04/2024 Delivering provider: CHUBB, CASEY C Date of discharge: 04/04/2024  Admitting diagnosis: Normal labor [O80, Z37.9] Intrauterine pregnancy: [redacted]w[redacted]d     Secondary diagnosis:  Principal Problem:   SVD (spontaneous vaginal delivery)  Additional problems: N/a    Discharge diagnosis: Term Pregnancy Delivered and Gestational Hypertension                                              Post partum procedures:{Postpartum procedures:23558} Augmentation: N/A Complications: None  Hospital course: Onset of Labor With Vaginal Delivery      23 y.o. yo G1P0 at [redacted]w[redacted]d was admitted in Latent Labor on 04/03/2024. Labor course was complicated by *** Membrane Rupture Time/Date: 8:56 AM,04/04/2024  Delivery Method:Vaginal, Spontaneous Operative Delivery:N/A Episiotomy: None Lacerations:  1st degree;Vaginal Patient had a postpartum course complicated by ***.  She is ambulating, tolerating a regular diet, passing flatus, and urinating well. Patient is discharged home in stable condition on 04/04/24.  Newborn Data: Birth date:04/04/2024 Birth time:12:53 PM Gender:Female Living status:Living Apgars: ,  Weight:3020 g  Magnesium Sulfate received: No BMZ received: No Rhophylac:N/A MMR:N/A T-DaP:Given prenatally Flu: No RSV Vaccine received: No Transfusion:{Transfusion received:30440034}  Immunizations received: Immunization History  Administered Date(s) Administered   Tdap 01/09/2024    Physical exam  Vitals:   04/04/24 0901 04/04/24 0931 04/04/24 1001 04/04/24 1031  BP: 130/66 119/64 119/70 121/77  Pulse: 92 (!) 107 (!) 169 (!) 107  Resp:      Temp:    99.7 F (37.6 C)  TempSrc:    Oral  SpO2:       General: {Exam; general:21111117} Lochia: {Desc; appropriate/inappropriate:30686::appropriate} Uterine  Fundus: {Desc; firm/soft:30687} Incision: {Exam; incision:21111123} DVT Evaluation: {Exam; dvt:2111122} Labs: Lab Results  Component Value Date   WBC 14.0 (H) 04/04/2024   HGB 14.7 04/04/2024   HCT 41.9 04/04/2024   MCV 101.0 (H) 04/04/2024   PLT 254 04/04/2024       No data to display         Edinburgh Score:     No data to display         No data recorded  After visit meds:  Allergies as of 04/04/2024   No Known Allergies   Med Rec must be completed prior to using this [facility]***        Discharge home in stable condition Infant Feeding: {Baby feeding:23562} Infant Disposition:{CHL IP OB HOME WITH WGNFAO:13086} Discharge instruction: per After Visit Summary and Postpartum booklet. Activity: Advance as tolerated. Pelvic rest for 6 weeks.  Diet: routine diet Future Appointments:No future appointments. Follow up Visit: Message sent to [facility] 6/*19   Message sent 04/04/24: Please schedule this patient for a In person postpartum visit in 4 weeks with the following provider: Any provider. Additional Postpartum F/U:none  Low risk pregnancy complicated by: none Delivery mode:  Vaginal, Spontaneous Anticipated Birth Control:  Condoms   04/04/2024 Melanie Spires, MD

## 2024-04-04 NOTE — H&P (Addendum)
 OBSTETRIC ADMISSION HISTORY AND PHYSICAL  Abigail Patterson is a 23 y.o. female G1P0 with IUP at [redacted]w[redacted]d by LMP presenting for SOL. She reports +FMs, No LOF, no VB, no blurry vision, headaches or peripheral edema, and RUQ pain.  She plans on breast and formula feeding. She request condoms for birth control. [facility]    Dating: By LMP --->  Estimated Date of Delivery: 04/04/24  Sono:    @[redacted]w[redacted]d , CWD, normal anatomy, variable presentation, posterior placental lie, 247g, 23% EFW   Prenatal History/Complications: none  Past Medical History: Past Medical History:  Diagnosis Date   Medical history non-contributory     Past Surgical History: Past Surgical History:  Procedure Laterality Date   TONSILLECTOMY     WISDOM TOOTH EXTRACTION      Obstetrical History: OB History     Gravida  1   Para      Term      Preterm      AB      Living         SAB      IAB      Ectopic      Multiple      Live Births              Social History Social History   Socioeconomic History   Marital status: Single    Spouse name: Not on file   Number of children: Not on file   Years of education: Not on file   Highest education level: Not on file  Occupational History   Not on file  Tobacco Use   Smoking status: Never   Smokeless tobacco: Never  Vaping Use   Vaping status: Never Used  Substance and Sexual Activity   Alcohol use: Never   Drug use: Never   Sexual activity: Not Currently  Other Topics Concern   Not on file  Social History Narrative   Not on file   Social Drivers of Health   Financial Resource Strain: Not on file  Food Insecurity: No Food Insecurity (09/19/2023)   Hunger Vital Sign    Worried About Running Out of Food in the Last Year: Never true    Ran Out of Food in the Last Year: Never true  Transportation Needs: No Transportation Needs (09/19/2023)   PRAPARE - Administrator, Civil Service (Medical):  No    Lack of Transportation (Non-Medical): No  Physical Activity: Not on file  Stress: Not on file  Social Connections: Not on file    Family History: Family History  Problem Relation Age of Onset   Hypertension Maternal Grandmother    Diabetes Maternal Grandmother    Diabetes Maternal Grandfather    Hypertension Maternal Grandfather    Hypertension Paternal Grandmother    Diabetes Paternal Grandmother    Diabetes Paternal Grandfather    Hypertension Paternal Grandfather     Allergies: No Known Allergies  Medications Prior to Admission  Medication Sig Dispense Refill Last Dose/Taking   Prenatal Vit-Fe Fumarate-FA (PRENATAL PLUS VITAMIN/MINERAL) 27-1 MG TABS Take 1 tablet by mouth daily. 30 tablet 11 04/04/2024 Morning   acetaminophen (TYLENOL) 325 MG tablet Take 650 mg by mouth every 6 (six) hours as needed.      aspirin  EC 81 MG tablet Take 1 tablet (81 mg total) by mouth daily. Swallow whole. (Patient not taking: Reported on 04/02/2024) 30 tablet 12    Blood Pressure Monitoring (BLOOD PRESSURE KIT) DEVI  1 Device by Does not apply route as needed. (Patient not taking: Reported on 04/02/2024) 1 each 0    ferrous gluconate  (FERGON) 324 MG tablet Take 1 tablet (324 mg total) by mouth every other day. (Patient not taking: Reported on 04/02/2024) 45 tablet 1    Misc. Devices (GOJJI WEIGHT SCALE) MISC 1 Device by Does not apply route as needed. (Patient not taking: Reported on 04/02/2024) 1 each 0      Review of Systems   All systems reviewed and negative except as stated in HPI  Blood pressure 131/77, pulse (!) 102, temperature 97.6 F (36.4 C), temperature source Oral, resp. rate 20, last menstrual period 06/29/2023, SpO2 100%. General appearance: alert and cooperative Lungs: clear to auscultation bilaterally Heart: regular rate and rhythm Abdomen: soft, non-tender; bowel sounds normal Pelvic: adequate Extremities: Homans sign is negative, no sign of DVT Presentation:  cephalic Fetal monitoringBaseline: 130s bpm, Variability: Good {> 6 bpm), Accelerations: Reactive, and Decelerations: Absent Uterine activityFrequency: Every 3-5 minutes Dilation: 5 Effacement (%): 100 Station: 0 Exam by:: Gae Jointer RN   Prenatal labs: ABO, Rh: O/Positive/-- (12/03 1600) Antibody: Negative (12/03 1600) Rubella: 11.20 (12/03 1600) RPR: Non Reactive (03/25 0859)  HBsAg: Negative (12/03 1600)  HIV: Non Reactive (03/25 0859)  GBS: Positive/-- (05/28 0931)    Lab Results  Component Value Date   GBS Positive (A) 03/13/2024   GTT normal Genetic screening  low risk Anatomy US  normal  Immunization History  Administered Date(s) Administered   Tdap 01/09/2024    Prenatal Transfer Tool  Maternal Diabetes: No Genetic Screening: Normal Maternal Ultrasounds/Referrals: Normal Fetal Ultrasounds or other Referrals:  None Maternal Substance Abuse:  No Significant Maternal Medications:  None Significant Maternal Lab Results: Group B Strep positive Number of Prenatal Visits:greater than 3 verified prenatal visits Maternal Vaccinations:TDap Other Comments:  None   No results found for this or any previous visit (from the past 24 hours).  Patient Active Problem List   Diagnosis Date Noted   Supervision of other normal pregnancy, antepartum 09/18/2023    Assessment/Plan:  Roosevelt Bisher is a 23 y.o. G1P0 at [redacted]w[redacted]d here for SOL.    #Labor:early latent. Will avoid further checks/interventions until 4 hours of PCN.  #Pain: Per patient request. Awaiting labs to place epidural.  #FWB: Cat 1 #GBS status:  Positive, PCN ordered #Feeding: Breastmilk  and Formula #Reproductive Life planning: Condoms #Circ:  not applicable  Andrez Keel MD [facility] [place] Resident PGY-1  Center for [facility], [facility] 04/04/24 12:25 AM  Attestation of Supervision of Student:  I confirm that I have verified the information documented in the  resident's note and that I have also personally directly supervised the history, physical exam and all medical decision making activities.  I have verified that all services and findings are accurately documented in this student's note; and I agree with management and plan as outlined in the documentation. I have also made any necessary editorial changes.  Maud Sorenson, MD OB Fellow 04/04/2024 8:56 PM

## 2024-04-04 NOTE — Anesthesia Preprocedure Evaluation (Signed)
Anesthesia Evaluation  Patient identified by MRN, date of birth, ID band Patient awake    Reviewed: Allergy & Precautions, NPO status , Patient's Chart, lab work & pertinent test results  Airway Mallampati: II  TM Distance: >3 FB Neck ROM: Full    Dental no notable dental hx.    Pulmonary neg pulmonary ROS   Pulmonary exam normal breath sounds clear to auscultation       Cardiovascular negative cardio ROS Normal cardiovascular exam Rhythm:Regular Rate:Normal     Neuro/Psych negative neurological ROS  negative psych ROS   GI/Hepatic negative GI ROS, Neg liver ROS,,,  Endo/Other  negative endocrine ROS    Renal/GU negative Renal ROS  negative genitourinary   Musculoskeletal negative musculoskeletal ROS (+)    Abdominal   Peds  Hematology negative hematology ROS (+)   Anesthesia Other Findings   Reproductive/Obstetrics (+) Pregnancy                             Anesthesia Physical Anesthesia Plan  ASA: 2  Anesthesia Plan: Epidural   Post-op Pain Management:    Induction:   PONV Risk Score and Plan: Treatment may vary due to age or medical condition  Airway Management Planned: Natural Airway  Additional Equipment:   Intra-op Plan:   Post-operative Plan:   Informed Consent: I have reviewed the patients History and Physical, chart, labs and discussed the procedure including the risks, benefits and alternatives for the proposed anesthesia with the patient or authorized representative who has indicated his/her understanding and acceptance.       Plan Discussed with: Anesthesiologist  Anesthesia Plan Comments: (Patient identified. Risks, benefits, options discussed with patient including but not limited to bleeding, infection, nerve damage, paralysis, failed block, incomplete pain control, headache, blood pressure changes, nausea, vomiting, reactions to medication, itching, and  post partum back pain. Confirmed with bedside nurse the patient's most recent platelet count. Confirmed with the patient that they are not taking any anticoagulation, have any bleeding history or any family history of bleeding disorders. Patient expressed understanding and wishes to proceed. All questions were answered. )       Anesthesia Quick Evaluation  

## 2024-04-05 DIAGNOSIS — O99820 Streptococcus B carrier state complicating pregnancy: Secondary | ICD-10-CM

## 2024-04-05 LAB — CBC
HCT: 35.1 % — ABNORMAL LOW (ref 36.0–46.0)
Hemoglobin: 12.1 g/dL (ref 12.0–15.0)
MCH: 35.1 pg — ABNORMAL HIGH (ref 26.0–34.0)
MCHC: 34.5 g/dL (ref 30.0–36.0)
MCV: 101.7 fL — ABNORMAL HIGH (ref 80.0–100.0)
Platelets: 195 10*3/uL (ref 150–400)
RBC: 3.45 MIL/uL — ABNORMAL LOW (ref 3.87–5.11)
RDW: 12.5 % (ref 11.5–15.5)
WBC: 14.8 10*3/uL — ABNORMAL HIGH (ref 4.0–10.5)
nRBC: 0 % (ref 0.0–0.2)

## 2024-04-05 MED ORDER — IBUPROFEN 600 MG PO TABS: 600.0000 mg | ORAL_TABLET | Freq: Four times a day (QID) | ORAL | 1 refills | Status: AC

## 2024-04-05 MED ORDER — ACETAMINOPHEN 325 MG PO TABS: 650.0000 mg | ORAL_TABLET | ORAL | Status: AC | PRN

## 2024-04-05 NOTE — Progress Notes (Shared)
 POSTPARTUM PROGRESS NOTE  Post Partum Day #1 Subjective:  Abigail Patterson is a 23 y.o. G1P1001 [redacted]w[redacted]d s/p SVD.  No acute events overnight.  Pt denies problems with ambulating, voiding or po intake.  She denies nausea or vomiting.  Pain is well controlled.  She has not had flatus. She has not had bowel movement.  Lochia Minimal. Educated patient on taking stool softeners as needed.  Objective: Blood pressure 107/64, pulse 95, temperature 97.9 F (36.6 C), temperature source Oral, resp. rate 18, last menstrual period 06/29/2023, SpO2 98%, unknown if currently breastfeeding.  Physical Exam:  General: alert, cooperative and no distress Lochia:normal flow Chest: CTAB Heart: RRR no m/r/g Abdomen: +BS, soft, nontender,  Uterine Fundus: firm DVT Evaluation: No calf swelling or tenderness Extremities: No edema  Recent Labs    04/04/24 0055 04/05/24 0504  HGB 14.7 12.1  HCT 41.9 35.1*    Assessment/Plan:  ASSESSMENT: Abigail Patterson is a 23 y.o. G1P1001 [redacted]w[redacted]d s/p SVD  # Feeding: Breast # Contraception: Depo shot, would like to get at 6 week f/u OP   DC today?   LOS: 1 day   Darryl Endow, Student-PA 04/05/2024, 7:34 AM

## 2024-04-06 LAB — BIRTH TISSUE RECOVERY COLLECTION (PLACENTA DONATION)

## 2024-04-06 NOTE — Discharge Summary (Signed)
 Postpartum Discharge Summary                              Patient Name: Abigail Patterson DOB: Feb 15, 2001 MRN: [id number]   Date of admission: 04/03/2024 Delivery date:04/04/2024 Delivering provider: CHUBB, CASEY C Date of discharge: 04/06/24    Admitting diagnosis: Normal labor [O80, Z37.9] Intrauterine pregnancy: [redacted]w[redacted]d     Secondary diagnosis:  Principal Problem:   SVD (spontaneous vaginal delivery) Active Problems:   Group B Streptococcus carrier, antepartum   Additional problems: N/a                              Discharge diagnosis: Term Pregnancy Delivered and Gestational Hypertension                                              Post partum procedures:none Augmentation: N/A Complications: None   Hospital course: Onset of Labor With Vaginal Delivery      23 y.o. yo G1P1001 at [redacted]w[redacted]d was admitted in Latent Labor on 04/03/2024. Labor course was complicated by nothing  Membrane Rupture Time/Date: 8:56 AM,04/04/2024  Delivery Method:Vaginal, Spontaneous Operative Delivery:N/A Episiotomy: None Lacerations:  1st degree;Vaginal Patient had a postpartum course complicated by nothing.  She is ambulating, tolerating a regular diet, passing flatus, and urinating well. Patient is discharged home in stable condition on 04/06/24     Newborn Data: Birth date:04/04/2024 Birth time:12:53 PM Gender:Female Living status:Living Apgars:8 ,9  Weight:3020 g   Magnesium Sulfate received: No BMZ received: No Rhophylac:N/A MMR:N/A T-DaP:Given prenatally Flu: No RSV Vaccine received: No Transfusion:No   Immunizations received:     Immunization History  Administered Date(s) Administered   Tdap 01/09/2024      Physical exam        Vitals:    04/04/24 2021 04/05/24 0030 04/05/24 0523 04/05/24 1223  BP: 113/71 110/71 107/64 120/76  Pulse: 96 86 95 90  Resp: 18 20 18 16   Temp: 98 F (36.7 C) 98 F (36.7 C) 97.9 F (36.6 C) 98.2 F (36.8 C)  TempSrc: Oral Oral Oral  Oral  SpO2: 99% 100% 98% 97%    General: alert, cooperative, and no distress Lochia: appropriate Uterine Fundus: firm Incision: N/A DVT Evaluation: No evidence of DVT seen on physical exam. Labs: Recent Labs       Lab Results  Component Value Date    WBC 14.8 (H) 04/05/2024    HGB 12.1 04/05/2024    HCT 35.1 (L) 04/05/2024    MCV 101.7 (H) 04/05/2024    PLT 195 04/05/2024            No data to display           Edinburgh Score:     04/04/2024    3:00 PM  Edinburgh Postnatal Depression Scale Screening Tool  I have been able to laugh and see the funny side of things. 0  I have looked forward with enjoyment to things. 0  I have blamed myself unnecessarily when things went wrong. 1  I have been anxious or worried for no good reason. 0  I have felt scared or panicky for no good reason. 0  Things have been getting on top of me. 1  I have been so unhappy that I have had  difficulty sleeping. 0  I have felt sad or miserable. 0  I have been so unhappy that I have been crying. 0  The thought of harming myself has occurred to me. 0  Edinburgh Postnatal Depression Scale Total 2    Edinburgh Postnatal Depression Scale Total: 2     After visit meds:  Allergies as of 04/05/2024   No Known Allergies         Medication List       STOP taking these medications     aspirin  EC 81 MG tablet    Blood Pressure Kit Devi    ferrous gluconate  324 MG tablet Commonly known as: FERGON    Gojji Weight Scale Misc           TAKE these medications     acetaminophen  325 MG tablet Commonly known as: Tylenol  Take 2 tablets (650 mg total) by mouth every 4 (four) hours as needed (for pain scale < 4). What changed:  when to take this reasons to take this    ibuprofen  600 MG tablet Commonly known as: ADVIL  Take 1 tablet (600 mg total) by mouth every 6 (six) hours.    Prenatal Plus Vitamin/Mineral 27-1 MG Tabs Take 1 tablet by mouth daily.               Discharge home in  stable condition Infant Feeding: Bottle and Breast Infant Disposition:home with mother Discharge instruction: per After Visit Summary and Postpartum booklet. Activity: Advance as tolerated. Pelvic rest for 6 weeks.  Diet: routine diet Future Appointments:       Future Appointments  Date Time Provider Department Center  05/17/2024 11:15 AM Zina Jerilynn LABOR, MD [facility]    Follow up Visit:   Follow-up Information       [facility] Follow up in 6 week(s).   Specialty: Obstetrics and Gynecology Contact information: [address] [place]  [address] [phone]            [facility] .   Specialty: Obstetrics and Gynecology Why: As needed for emergencies Contact information: [address] [place]  [address] [phone]                      Message sent to [facility] 6/19    Message sent 04/05/24: Please schedule this patient for a In person postpartum visit in 4 weeks with the following provider: Any provider. Additional Postpartum F/U:none  Low risk pregnancy complicated by: none Delivery mode:  Vaginal, Spontaneous Anticipated Birth Control:  Condoms

## 2024-04-06 NOTE — Lactation Note (Signed)
 This note was copied from a baby's chart. Lactation Consultation Note  Patient Name: Abigail Patterson Date: 04/06/2024 Age:23 hours  Reason for consult: Initial assessment;Primapara;1st time breastfeeding;Term  P1, [redacted]w[redacted]d, 6% weight loss  Initial LC visit with mother on day of discharge. Mother had originally declined lactation services. She wanted to exclusively breast feed. Due to difficulty latching baby, she has been formula feeding for the last 24 plus hours. Mother wanting to see [facility] today for breastfeeding assistance.   Mother reports breast growth during pregnancy. Her breast require support with pillows and by hand for baby to maintain her latch. Assisted mother and baby in football hold. It took baby several attempts before she was able to sustain her latch on the breast. Mother has colostrum that is rolling and fullness, nodular areas noted. Once baby latched, she fed well with audible swallows. Mother's breast softened and she states she felt much better. Techniques discussed to support her breast for a deep latch and avoid the weight of the breast on infant's chest or occluding baby's airway..   Mother received her [facility] at [facility] on [address] and she was very receptive to attend an OP Lactation Consultation for further breast feeding support. Mother states her mother breast fed and will be able to help once at home. Referral to sent [facility] for OP [facility] request.   Mom made aware of O/P services, breastfeeding support groups, community resources, and our phone # for post-discharge questions.     Maternal Data Has patient been taught Hand Expression?: Yes Does the patient have breastfeeding experience prior to this delivery?: No  Feeding Mother's Current Feeding Choice: Breast Milk and Formula Nipple Type: Slow - flow  LATCH Score Latch: Repeated attempts needed to sustain latch, nipple held in mouth throughout feeding, stimulation needed to elicit sucking  reflex.  Audible Swallowing: Spontaneous and intermittent  Type of Nipple: Everted at rest and after stimulation  Comfort (Breast/Nipple): Filling, red/small blisters or bruises, mild/mod discomfort (filling)  Hold (Positioning): Assistance needed to correctly position infant at breast and maintain latch.  LATCH Score: 7    Interventions Interventions: Breast feeding basics reviewed;Assisted with latch;Hand express;Breast compression;Adjust position;Support pillows;Position options;Expressed milk;DEBP;Education;LC Services brochure;CDC milk storage guidelines;CDC Guidelines for Breast Pump Cleaning  Discharge Discharge Education: Engorgement and breast care;Warning signs for feeding baby;Outpatient recommendation;Outpatient Epic message sent Pump: Hands Free  Consult Status Consult Status: Complete    Joshua Rojelio HERO 04/06/2024, 12:29 PM

## 2024-04-07 ENCOUNTER — Inpatient Hospital Stay (HOSPITAL_COMMUNITY)
Admission: AD | Admit: 2024-04-07 | Discharge: 2024-04-08 | Disposition: A | Discharge status: Home / Self Care | Attending: Obstetrics and Gynecology | Admitting: Obstetrics and Gynecology

## 2024-04-07 ENCOUNTER — Other Ambulatory Visit: Payer: Self-pay

## 2024-04-07 DIAGNOSIS — N6459 Other signs and symptoms in breast: Secondary | ICD-10-CM | POA: Insufficient documentation

## 2024-04-07 DIAGNOSIS — N643 Galactorrhea not associated with childbirth: Secondary | ICD-10-CM

## 2024-04-07 DIAGNOSIS — O9279 Other disorders of lactation: Secondary | ICD-10-CM

## 2024-04-07 MED ORDER — OXYCODONE HCL 5 MG PO TABS
5.0000 mg | ORAL_TABLET | Freq: Once | ORAL | Status: AC
  Administered 2024-04-07: 5 mg via ORAL
  Filled 2024-04-07: qty 1

## 2024-04-07 MED ORDER — PSEUDOEPHEDRINE HCL 30 MG PO TABS
60.0000 mg | ORAL_TABLET | Freq: Once | ORAL | Status: AC
  Administered 2024-04-07: 60 mg via ORAL
  Filled 2024-04-07: qty 2

## 2024-04-07 MED ORDER — ACETAMINOPHEN 500 MG PO TABS
1000.0000 mg | ORAL_TABLET | Freq: Once | ORAL | Status: AC
Start: 2024-04-07 — End: 2024-04-07
  Administered 2024-04-07: 1000 mg via ORAL
  Filled 2024-04-07: qty 2

## 2024-04-07 MED ORDER — IBUPROFEN 600 MG PO TABS
600.0000 mg | ORAL_TABLET | Freq: Once | ORAL | Status: AC
  Administered 2024-04-07: 600 mg via ORAL
  Filled 2024-04-07: qty 1

## 2024-04-07 MED ORDER — COCONUT OIL OIL
1.0000 | TOPICAL_OIL | Status: DC | PRN
  Administered 2024-04-07: 1 via TOPICAL
  Filled 2024-04-07: qty 7.5

## 2024-04-07 NOTE — MAU Provider Note (Cosign Needed Addendum)
 Chief Complaint:  engorgement   None     HPI  Abigail Patterson is a 23 y.o. G1P1001 who presents to maternity admissions reporting engorged breast. She reports that it started yesterday around the time of discharge, and got significantly worse as the evening went on. Has felt chills last night and all day today. She is breastfeeding her baby every 2 hours and pumping in between. She denies fever, HA, n/v.  Past Medical History: Past Medical History:  Diagnosis Date   Medical history non-contributory     Past obstetric history: OB History  Gravida Para Term Preterm AB Living  1 1 1   1   SAB IAB Ectopic Multiple Live Births     0 1    # Outcome Date GA Lbr Len/2nd Weight Sex Type Anes PTL Lv  1 Term 04/04/24 [redacted]w[redacted]d / 01:53 6 lb 10.5 oz (3.02 kg) F Vag-Spont EPI  LIV    Past Surgical History: Past Surgical History:  Procedure Laterality Date   TONSILLECTOMY     WISDOM TOOTH EXTRACTION      Family History: Family History  Problem Relation Age of Onset   Hypertension Maternal Grandmother    Diabetes Maternal Grandmother    Diabetes Maternal Grandfather    Hypertension Maternal Grandfather    Hypertension Paternal Grandmother    Diabetes Paternal Grandmother    Diabetes Paternal Grandfather    Hypertension Paternal Grandfather     Social History: Social History   Tobacco Use   Smoking status: Never   Smokeless tobacco: Never  Vaping Use   Vaping status: Never Used  Substance Use Topics   Alcohol use: Never   Drug use: Never    Allergies: No Known Allergies  Meds:  No medications prior to admission.    I have reviewed patient's Past Medical Hx, Surgical Hx, Family Hx, Social Hx, medications and allergies.  ROS  Pertinent items noted in HPI, comprehensive review of systems otherwise negative.  PHYSICAL EXAM  Patient Vitals for the past 24 hrs:  BP Temp Temp src Pulse Resp SpO2 Height Weight  04/08/24 0050 116/65 98.6 F (37 C) Oral 93 18 98 % -- --   04/07/24 2343 -- 98.2 F (36.8 C) Oral -- -- -- -- --  04/07/24 2108 (!) 136/99 99.6 F (37.6 C) Oral 97 -- 98 % -- --  04/07/24 1927 -- 98.6 F (37 C) Oral -- -- -- -- --  04/07/24 1833 (!) 137/92 98.5 F (36.9 C) Oral 94 17 99 % 5' 3 (1.6 m) 175 lb 1.6 oz (79.4 kg)   Constitutional: Well-developed, well-nourished female in no acute distress.  Cardiovascular: normal rate and rhythm, warm and well perfused Respiratory: normal effort, no distress GI: Abd soft, non-tender, non-distended MS: Extremities nontender, no edema, normal ROM Neurologic: Alert and oriented x 4.  GU: Neg CVAT. Skin:  Warm and Dry, bilateral breasts shiny, and hot to touch, grimacing with palpation Psych:  Affect appropriate.       Labs: No results found for this or any previous visit (from the past 24 hours). --/--/O POS (06/19 9945)  Imaging:  No results found.  MDM & MAU COURSE  MDM:  Rollene DEAR to bedside to assist with breast pump. Plan alternate double electric breast pump and ice in 20 minute increments to allow for breast decompression. Pain medication administered Coconut oil applied to nipples.  50 mLs EBM with first pumping session.  Patient with increasing engorgement after reassessment at 2130. Sudafed ordered  to relieve engorgement and addition of heat only while pumping and ice between. Relief noted.  Lymphatic drainage instructions with images and demonstration provided.   MAU Course:  Report given to and care assumed by Camie Rote, CNM at 2010  ASSESSMENT   1. Breast engorgement   2. Hyperlactation   3. Postpartum breast engorgement     PLAN  Discharge home in stable condition with precautions. Sudafed sent for use every 4 hours for reduction of engorgement and milk production. Patient to rest as able, pump every 2-3 hours after 30 minutes ice and then apply heat while pumping. Instructions and demonstration for lymphatic drainage provided.   Outpatient LC ASAP  - Message sent to [facility] lactation team.   Encouraged to return here or to other Urgent Care/ED if she develops worsening of symptoms, increase in pain, fever, or other concerning symptoms.   Mitzie Molly, SNM  Camie Rote, MSN, CNM 04/08/2024 3:52 AM

## 2024-04-07 NOTE — Lactation Note (Signed)
 Lactation Consultation Note  Patient Name: Abigail Patterson Unijb'd Date: 04/07/2024 Age:23 y.o. Reason for consult: Initial assessment;RN request;Term (LC was called to [facility] to see this severely bilaterally engorged mom. Baby is not with mom .) Dyad was D/C yesterday on the 6/21 and the [facility] saw mom prior to discharge and latched the baby with latch score of 7.  The MAU nurse that called the [facility] was instructed to ice the moms over the tops of the breast and the lateral sides. Prior to the [facility] coming to see this mom, ' Per mom milk came in after D/C and the baby wasn't latching very long and had to be fed a bottle. Mom started pumping with her hand free [place] and it wasn't doing well to give her relieve so she switched to her Motif DEBP for 30 mins, and pumping often.  Per mom when her milk really was coming in was increasing her pumping and started having severe engorgement .  Per mom has not experienced a temp, but has had chills.  LC assessment - both breast are severely engorged both breast with areas of firmness , especially the upper areas of both  breast and the lateral  aspects ( inner and outer) . Majority of both breast are firm, with some areas of just being full. Areolas compress , but full.  Both nipples have positional strips where the baby probably got latched and shallow.  LC set up the DEBP and started with #24 Flanges both breast and continues to ice while pumping and mom in a laid back position while the [facility] assisted with the double pumping with 45 ml EBM yield.  LC turned off the DEBP to give mom a break and CNM adjusted ice packs to cover more service areas of the breast.  LC recommended Motrin  for the pain and engorgement. Given at 1945 by the nurse.    LC plan for now if to ice for 30 mins , rest mom and then pump with DEBP again for 20 mins.  Important not to allow  the breast to get over full  and to release both breast  so mom is comfortable.   MAU nurse and the CNM aware  of the recommendations.    Maternal Data Has patient been taught Hand Expression?: Yes Does the patient have breastfeeding experience prior to this delivery?: No  Feeding Mother's Current Feeding Choice: Breast Milk and Formula  LATCH Score - baby not with mom    Lactation Tools Discussed/Used Tools: Pump;Flanges Flange Size: 24;27 Breast pump type: Double-Electric Breast Pump Pump Education: Setup, frequency, and cleaning;Milk Storage Reason for Pumping: Severe engorgement Pumped volume: 50 mL (1st pumping after icing both breast)  Interventions  Education and support  Discharge Discharge Education: Engorgement and breast care Pump: Hands Free;DEBP;Personal WIC Program: Yes  Consult Status  F/U request made by the CNM to [facility] to see if she can be seen in the [facility] O/P clinic tomorrow.     Rollene Caldron Yaiza Palazzola 04/07/2024, 7:56 PM

## 2024-04-07 NOTE — MAU Note (Signed)
 Abigail Patterson is a 23 y.o. at Unknown here in MAU reporting: breast engorgement   Feeling breast burning on both sides. Felt like she had chills earlier. No redness visible. Pt visibly engorged. Pt had a vaginal delivery on the 19th. Pt says she has been struggling with latching. Attempts to latch/nurse baby every 2 hours. States she sometimes pumps before latching and then after as well when she still felt full. She is unsure of her flange size. Lactation called to come and see the patient.   Patient in room 121 in MAU and ice packs applied to upper and lower breasts.

## 2024-04-08 ENCOUNTER — Telehealth: Payer: Self-pay | Admitting: Lactation Services

## 2024-04-08 DIAGNOSIS — N6459 Other signs and symptoms in breast: Secondary | ICD-10-CM

## 2024-04-08 MED ORDER — IBUPROFEN 800 MG PO TABS
800.0000 mg | ORAL_TABLET | Freq: Three times a day (TID) | ORAL | 0 refills | Status: AC
Start: 2024-04-08 — End: ?

## 2024-04-08 MED ORDER — COCONUT OIL OIL
1.0000 | TOPICAL_OIL | 2 refills | Status: AC | PRN
Start: 2024-04-08 — End: ?

## 2024-04-08 MED ORDER — PSEUDOEPHEDRINE HCL 60 MG PO TABS
60.0000 mg | ORAL_TABLET | ORAL | 0 refills | Status: AC | PRN
Start: 2024-04-08 — End: ?

## 2024-04-08 NOTE — Telephone Encounter (Signed)
-----   Message from Claris CHRISTELLA Cedar sent at 04/07/2024  7:51 PM EDT ----- Regarding: Severe Engorgemnt Abigail Patterson,   This patient was seen in MAU with severe engorgement. She is 1 wk PP and was discharged home yesterday. She was seen by Lactation today and previously referred for OP consult but I wanted to see if you could squeeze her in for a immediate appt either Monday 6/23 or Tuesday 6/24 at the latest? Or at lease call to check in. Please see MAU note for details and imaging.   Thank you in advance if you can!   - Claris, CNM

## 2024-04-08 NOTE — Telephone Encounter (Signed)
 Message from front office that patient called back and wants Lactation to call her back.   Called patient back . She reports she pumped last night every 2-3 hours for 30 minutes with DEBP,  after ice pack for 10 minutes. She is not taking Ibuprofen . She is doing reverse pressure with deep and gentle massage, reviewed no deep massage. She is planning to pick up Ibuprofen  today. She pumped out 5 ounces this morning.   Breasts are still feeling hard and firm this morning. They hardened again after pumping. Reviewed to continue treatment of Engorgement as she has been and that it can take up to 48 hours for completed improvement.   Infant is trying to latch but will only stay on for a few seconds and then pull off. Offered OP appt for assistance with latch, mom agreeable. Reviewed prepumping to soften areola prior to latch.   She took the Sudafed last night only, asked patient to stop taking to prevent drying of milk. She does not have a fever, she does have a headache, she took Tylenol  this morning, it did help some but headache is back. Reviewed s/s of PreE and when to go back to hospital for evaluation.   Location, date and time of appointment given. Mom advised to bring infant hungry, EBM in bottle and pump . Reviewed can bring support person.   Reviewed to continue ice packs, pumping every 2-3 hours, latching if infant able and post pump if still full, start Ibuprofen  800 mg every 8 hours, stop deep massage, continue reverse pressure softening. Follow up with Lactation tomorrow morning.

## 2024-04-08 NOTE — Telephone Encounter (Signed)
 Called patient to reassess engorgement situation. Patient did not answer. LM for patient to call the office at [phone] to leave a message for Lactation and to check her Mychart message. Will follow up later today.

## 2024-04-08 NOTE — Discharge Instructions (Signed)
 Dajanay,   Continue ice for 30 minutes followed by heat when you pump. Pump only until comfort every 2-3 hours. Follow these directions:   Engorgement  #1 Do not pump or continuously feed baby to reduce engorgement (this sets up a vicious cycle that makes everything WORSE).  If necessary, use a hand pump to remove as little volume as possible to make yourself comfortable.  #2 lymphatic drainage  #3 Support your breasts with a good bra.  If you don't want to wear a bra, that's fine.  But know that if you have lots of engorgement and/or larger breasts, swelling in the connective tissue will collect in the bottom parts of your breast if you go without a bra.  This is the same as what happens if you were to sit in a car for 12 hours and not move your legs: your ankles would swell.  If you wear a supportive bra, you will help your breast more naturally drain this fluid and it will improve pain and redness.  It a SERIOUS myth that bras cause mastitis, and it has resulted in so many women suffering from back pain, chest pain, and lymphedema (swelling in the lower portions of the breast). #Takebackthebras!  Even small breasted women can develop lymphedema.

## 2024-04-08 NOTE — Telephone Encounter (Signed)
 Opened in error

## 2024-04-09 ENCOUNTER — Other Ambulatory Visit

## 2024-04-09 ENCOUNTER — Encounter: Payer: Self-pay | Admitting: Obstetrics and Gynecology

## 2024-04-11 ENCOUNTER — Inpatient Hospital Stay (HOSPITAL_COMMUNITY): Admission: RE | Admit: 2024-04-11 | Source: Home / Self Care | Admitting: Obstetrics & Gynecology

## 2024-04-11 ENCOUNTER — Inpatient Hospital Stay (HOSPITAL_COMMUNITY)

## 2024-04-13 ENCOUNTER — Telehealth (HOSPITAL_COMMUNITY): Payer: Self-pay

## 2024-04-13 NOTE — Telephone Encounter (Signed)
 04/13/2024 1753  Name: Abigail Patterson MRN: [id number] DOB: [birth year]  Reason for Call:  Transition of Care Hospital Discharge Call  Contact Status: Patient Contact Status: Message  Language assistant needed:          Follow-Up Questions:    Van Postnatal Depression Scale:  In the Past 7 Days:    PHQ2-9 Depression Scale:     Discharge Follow-up:    Post-discharge interventions: NA  Signature  Rosaline Deretha PEAK

## 2024-05-17 ENCOUNTER — Ambulatory Visit: Admitting: Obstetrics and Gynecology

## 2024-05-17 ENCOUNTER — Other Ambulatory Visit: Payer: Self-pay

## 2024-05-17 ENCOUNTER — Encounter: Payer: Self-pay | Admitting: Obstetrics and Gynecology

## 2024-05-17 NOTE — Progress Notes (Signed)
 Post Partum Visit Note  Abigail Patterson is a 23 y.o. G32P1001 female who presents for a postpartum visit. She is 6 weeks postpartum following a normal spontaneous vaginal delivery.  I have fully reviewed the prenatal and intrapartum course. The delivery was at 40 gestational weeks.  Anesthesia: epidural. Postpartum course has been uncomplicated. Baby is doing well. Baby is feeding by bottle - Similac Sensitive RS. Bleeding moderate lochia. Bowel function is normal. Bladder function is normal. Patient is not sexually active. Contraception method is none. Postpartum depression screening: negative.   The pregnancy intention screening data noted above was reviewed. Potential methods of contraception were discussed. The patient elected to proceed with condoms.   Edinburgh Postnatal Depression Scale - 05/17/24 1200       Edinburgh Postnatal Depression Scale:  In the Past 7 Days   I have been able to laugh and see the funny side of things. 0    I have looked forward with enjoyment to things. 0    I have blamed myself unnecessarily when things went wrong. 0    I have been anxious or worried for no good reason. 0    I have felt scared or panicky for no good reason. 0    Things have been getting on top of me. 0    I have been so unhappy that I have had difficulty sleeping. 0    I have felt sad or miserable. 0    I have been so unhappy that I have been crying. 0    The thought of harming myself has occurred to me. 0    Edinburgh Postnatal Depression Scale Total 0          Health Maintenance Due  Topic Date Due   HPV VACCINES (1 - 3-dose series) Never done   Meningococcal B Vaccine (1 of 2 - Standard) Never done   Hepatitis B Vaccines (1 of 3 - 19+ 3-dose series) Never done   COVID-19 Vaccine (1 - 2024-25 season) Never done   INFLUENZA VACCINE  05/17/2024    The following portions of the patient's history were reviewed and updated as appropriate: allergies, current medications, past  family history, past medical history, past social history, past surgical history, and problem list.  Review of Systems Pertinent items are noted in HPI.  Objective:  BP 114/84   Pulse 70   Ht 5' 3 (1.6 m)   Wt 159 lb 8 oz (72.3 kg)   LMP 06/29/2023   Breastfeeding No   BMI 28.25 kg/m    General:  alert, cooperative, and no distress   Breasts:  not indicated  Lungs: clear to auscultation bilaterally  Heart:  regular rate and rhythm  Abdomen: soft, non-tender; bowel sounds normal; no masses,  no organomegaly   Wound N/a  GU exam:  not indicated       Assessment:    Encounter for postpartum  Normal postpartum exam.   Plan:   Essential components of care per ACOG recommendations:  1.  Mood and well being: Patient with negative depression screening today. Reviewed local resources for support.  - Patient tobacco use? No.   - hx of drug use? No.  (THC)  2. Infant care and feeding:  -Patient currently breastmilk feeding? No.  -Social determinants of health (SDOH) reviewed in EPIC. No concerns.  3. Sexuality, contraception and birth spacing - Patient does want a pregnancy in the next year.  Desired family size is 3 children.  -  Reviewed reproductive life planning. Reviewed contraceptive methods based on pt preferences and effectiveness.  Patient desired Female Condom today.  Discussed nexplanon, non/hormonal IUDs.  Pt desires next pregnancy in 3-4 years.  She will schedule if she desires these options. - Discussed birth spacing of 18 months  4. Sleep and fatigue -Encouraged family/partner/community support of 4 hrs of uninterrupted sleep to help with mood and fatigue  5. Physical Recovery  - Discussed patients delivery and complications. She describes her labor as good. - Patient had a Vaginal, no problems at delivery. Patient had a 1st degree laceration. Perineal healing reviewed. Patient expressed understanding - Patient has urinary incontinence? No. - Patient is safe to  resume physical and sexual activity  6.  Health Maintenance - HM due items addressed Yes - Last pap smear 09/06/22 Pap smear not done at today's visit.  -Breast Cancer screening indicated? No.   7. Chronic Disease/Pregnancy Condition follow up: None  - PCP follow up  Jerilynn DELENA Buddle, MD [facility], [facility]

## 2024-05-17 NOTE — Progress Notes (Signed)
 Abigail Patterson
